# Patient Record
Sex: Female | Born: 1960 | Race: Asian | Hispanic: No | Marital: Married | Smoking: Never smoker
Health system: Southern US, Community
[De-identification: ages and names within clinical notes are randomized; demographics above are authoritative.]

## PROBLEM LIST (undated history)

## (undated) DIAGNOSIS — N159 Renal tubulo-interstitial disease, unspecified: Secondary | ICD-10-CM

## (undated) DIAGNOSIS — C541 Malignant neoplasm of endometrium: Secondary | ICD-10-CM

## (undated) DIAGNOSIS — N8502 Endometrial intraepithelial neoplasia [EIN]: Secondary | ICD-10-CM

## (undated) HISTORY — DX: Endometrial intraepithelial neoplasia (EIN): N85.02

## (undated) HISTORY — PX: COLONOSCOPY: SHX174

## (undated) HISTORY — PX: DILATATION & CURETTAGE/HYSTEROSCOPY WITH MYOSURE: SHX6511

---

## 2008-05-10 ENCOUNTER — Encounter: Admission: RE | Admit: 2008-05-10 | Discharge: 2008-05-10 | Payer: Self-pay | Admitting: Obstetrics and Gynecology

## 2008-05-16 ENCOUNTER — Other Ambulatory Visit: Admission: RE | Admit: 2008-05-16 | Discharge: 2008-05-16 | Payer: Self-pay | Admitting: Obstetrics and Gynecology

## 2009-07-23 ENCOUNTER — Encounter: Admission: RE | Admit: 2009-07-23 | Discharge: 2009-07-23 | Payer: Self-pay | Admitting: Obstetrics and Gynecology

## 2009-08-28 ENCOUNTER — Other Ambulatory Visit: Admission: RE | Admit: 2009-08-28 | Discharge: 2009-08-28 | Payer: Self-pay | Admitting: Obstetrics and Gynecology

## 2010-07-07 ENCOUNTER — Other Ambulatory Visit: Admission: RE | Admit: 2010-07-07 | Discharge: 2010-07-07 | Payer: Self-pay | Admitting: Internal Medicine

## 2010-08-11 ENCOUNTER — Encounter
Admission: RE | Admit: 2010-08-11 | Discharge: 2010-08-11 | Payer: Self-pay | Source: Home / Self Care | Attending: Obstetrics and Gynecology | Admitting: Obstetrics and Gynecology

## 2011-11-03 ENCOUNTER — Other Ambulatory Visit: Payer: Self-pay | Admitting: Internal Medicine

## 2011-11-03 DIAGNOSIS — Z1231 Encounter for screening mammogram for malignant neoplasm of breast: Secondary | ICD-10-CM

## 2011-11-04 ENCOUNTER — Ambulatory Visit
Admission: RE | Admit: 2011-11-04 | Discharge: 2011-11-04 | Disposition: A | Discharge status: Home / Self Care | Payer: PRIVATE HEALTH INSURANCE | Source: Ambulatory Visit | Attending: Internal Medicine | Admitting: Internal Medicine

## 2011-11-04 DIAGNOSIS — Z1231 Encounter for screening mammogram for malignant neoplasm of breast: Secondary | ICD-10-CM

## 2011-11-10 ENCOUNTER — Ambulatory Visit: Payer: Self-pay

## 2013-01-29 ENCOUNTER — Other Ambulatory Visit: Payer: Self-pay

## 2013-01-29 DIAGNOSIS — Z1231 Encounter for screening mammogram for malignant neoplasm of breast: Secondary | ICD-10-CM

## 2013-03-01 ENCOUNTER — Ambulatory Visit: Payer: PRIVATE HEALTH INSURANCE

## 2013-03-13 ENCOUNTER — Ambulatory Visit
Admission: RE | Admit: 2013-03-13 | Discharge: 2013-03-13 | Disposition: A | Discharge status: Home / Self Care | Payer: PRIVATE HEALTH INSURANCE | Source: Ambulatory Visit

## 2013-03-13 DIAGNOSIS — Z1231 Encounter for screening mammogram for malignant neoplasm of breast: Secondary | ICD-10-CM

## 2013-11-21 LAB — HM COLONOSCOPY

## 2014-07-02 ENCOUNTER — Other Ambulatory Visit: Payer: Self-pay | Admitting: Obstetrics and Gynecology

## 2014-07-02 DIAGNOSIS — N63 Unspecified lump in unspecified breast: Secondary | ICD-10-CM

## 2014-07-09 ENCOUNTER — Ambulatory Visit
Admission: RE | Admit: 2014-07-09 | Discharge: 2014-07-09 | Disposition: A | Discharge status: Home / Self Care | Payer: Commercial Managed Care - PPO | Source: Ambulatory Visit | Attending: Obstetrics and Gynecology | Admitting: Obstetrics and Gynecology

## 2014-07-09 DIAGNOSIS — N63 Unspecified lump in unspecified breast: Secondary | ICD-10-CM

## 2014-07-12 ENCOUNTER — Other Ambulatory Visit: Payer: PRIVATE HEALTH INSURANCE

## 2014-12-24 ENCOUNTER — Encounter: Payer: Self-pay | Admitting: Family Medicine

## 2015-04-30 ENCOUNTER — Other Ambulatory Visit: Payer: Self-pay

## 2015-04-30 ENCOUNTER — Ambulatory Visit
Admission: RE | Admit: 2015-04-30 | Discharge: 2015-04-30 | Disposition: A | Discharge status: Home / Self Care | Payer: Commercial Managed Care - PPO | Source: Ambulatory Visit

## 2015-04-30 DIAGNOSIS — Z1231 Encounter for screening mammogram for malignant neoplasm of breast: Secondary | ICD-10-CM

## 2015-11-26 HISTORY — PX: HYSTEROSCOPY: SHX211

## 2016-02-04 ENCOUNTER — Encounter (INDEPENDENT_AMBULATORY_CARE_PROVIDER_SITE_OTHER): Payer: Commercial Managed Care - PPO | Admitting: Ophthalmology

## 2016-02-04 DIAGNOSIS — H5213 Myopia, bilateral: Secondary | ICD-10-CM

## 2016-02-04 DIAGNOSIS — H33302 Unspecified retinal break, left eye: Secondary | ICD-10-CM | POA: Diagnosis not present

## 2016-02-04 DIAGNOSIS — H43813 Vitreous degeneration, bilateral: Secondary | ICD-10-CM | POA: Diagnosis not present

## 2016-02-18 ENCOUNTER — Ambulatory Visit (INDEPENDENT_AMBULATORY_CARE_PROVIDER_SITE_OTHER): Payer: Commercial Managed Care - PPO | Admitting: Ophthalmology

## 2016-02-18 DIAGNOSIS — H33302 Unspecified retinal break, left eye: Secondary | ICD-10-CM

## 2016-05-05 ENCOUNTER — Other Ambulatory Visit (HOSPITAL_COMMUNITY): Payer: Self-pay | Admitting: Internal Medicine

## 2016-05-05 ENCOUNTER — Ambulatory Visit (HOSPITAL_COMMUNITY)
Admission: RE | Admit: 2016-05-05 | Discharge: 2016-05-05 | Disposition: A | Discharge status: Home / Self Care | Payer: Commercial Managed Care - PPO | Source: Ambulatory Visit | Attending: Internal Medicine | Admitting: Internal Medicine

## 2016-05-05 DIAGNOSIS — L0211 Cutaneous abscess of neck: Secondary | ICD-10-CM

## 2016-05-05 DIAGNOSIS — K122 Cellulitis and abscess of mouth: Secondary | ICD-10-CM | POA: Diagnosis not present

## 2016-05-05 DIAGNOSIS — R591 Generalized enlarged lymph nodes: Secondary | ICD-10-CM | POA: Diagnosis not present

## 2016-05-05 MED ORDER — IOPAMIDOL (ISOVUE-300) INJECTION 61%
75.0000 mL | Freq: Once | INTRAVENOUS | Status: AC | PRN
  Administered 2016-05-05: 75 mL via INTRAVENOUS

## 2016-06-21 ENCOUNTER — Ambulatory Visit (INDEPENDENT_AMBULATORY_CARE_PROVIDER_SITE_OTHER): Payer: Commercial Managed Care - PPO | Admitting: Ophthalmology

## 2016-08-04 ENCOUNTER — Ambulatory Visit (INDEPENDENT_AMBULATORY_CARE_PROVIDER_SITE_OTHER): Payer: Commercial Managed Care - PPO | Admitting: Ophthalmology

## 2016-10-21 ENCOUNTER — Ambulatory Visit (INDEPENDENT_AMBULATORY_CARE_PROVIDER_SITE_OTHER): Payer: Commercial Managed Care - PPO | Admitting: Ophthalmology

## 2016-10-21 DIAGNOSIS — H2513 Age-related nuclear cataract, bilateral: Secondary | ICD-10-CM | POA: Diagnosis not present

## 2016-10-21 DIAGNOSIS — H43813 Vitreous degeneration, bilateral: Secondary | ICD-10-CM

## 2016-10-21 DIAGNOSIS — H33302 Unspecified retinal break, left eye: Secondary | ICD-10-CM | POA: Diagnosis not present

## 2016-11-25 DIAGNOSIS — N920 Excessive and frequent menstruation with regular cycle: Secondary | ICD-10-CM | POA: Diagnosis not present

## 2016-12-07 DIAGNOSIS — S63521A Sprain of radiocarpal joint of right wrist, initial encounter: Secondary | ICD-10-CM | POA: Diagnosis not present

## 2016-12-20 DIAGNOSIS — N959 Unspecified menopausal and perimenopausal disorder: Secondary | ICD-10-CM | POA: Diagnosis not present

## 2016-12-20 DIAGNOSIS — N840 Polyp of corpus uteri: Secondary | ICD-10-CM | POA: Diagnosis not present

## 2017-01-24 DIAGNOSIS — Z0000 Encounter for general adult medical examination without abnormal findings: Secondary | ICD-10-CM | POA: Diagnosis not present

## 2017-01-24 DIAGNOSIS — N390 Urinary tract infection, site not specified: Secondary | ICD-10-CM | POA: Diagnosis not present

## 2017-05-26 DIAGNOSIS — N8502 Endometrial intraepithelial neoplasia [EIN]: Secondary | ICD-10-CM | POA: Diagnosis not present

## 2017-05-26 DIAGNOSIS — Z130 Encounter for screening for diseases of the blood and blood-forming organs and certain disorders involving the immune mechanism: Secondary | ICD-10-CM | POA: Diagnosis not present

## 2017-05-26 DIAGNOSIS — Z01419 Encounter for gynecological examination (general) (routine) without abnormal findings: Secondary | ICD-10-CM | POA: Diagnosis not present

## 2017-05-26 DIAGNOSIS — Z124 Encounter for screening for malignant neoplasm of cervix: Secondary | ICD-10-CM | POA: Diagnosis not present

## 2017-05-26 DIAGNOSIS — N840 Polyp of corpus uteri: Secondary | ICD-10-CM | POA: Diagnosis not present

## 2017-05-26 DIAGNOSIS — Z1231 Encounter for screening mammogram for malignant neoplasm of breast: Secondary | ICD-10-CM | POA: Diagnosis not present

## 2017-05-27 DIAGNOSIS — Z124 Encounter for screening for malignant neoplasm of cervix: Secondary | ICD-10-CM | POA: Diagnosis not present

## 2017-06-01 LAB — HM MAMMOGRAPHY

## 2017-06-07 ENCOUNTER — Telehealth: Payer: Self-pay | Admitting: *Deleted

## 2017-06-07 NOTE — Telephone Encounter (Signed)
Spoke with Janett Billow at [facility] and gave the new patient appt for November 5th at 12pm. Janett Billow will call the patient.

## 2017-06-27 ENCOUNTER — Encounter: Payer: Self-pay | Admitting: Gynecologic Oncology

## 2017-06-27 ENCOUNTER — Ambulatory Visit: Payer: Commercial Managed Care - PPO | Attending: Gynecologic Oncology | Admitting: Gynecologic Oncology

## 2017-06-27 VITALS — BP 127/82 | HR 64 | Temp 98.6°F | Resp 18 | Ht 61.0 in | Wt 126.3 lb

## 2017-06-27 DIAGNOSIS — N8500 Endometrial hyperplasia, unspecified: Secondary | ICD-10-CM | POA: Insufficient documentation

## 2017-06-27 DIAGNOSIS — N8502 Endometrial intraepithelial neoplasia [EIN]: Secondary | ICD-10-CM

## 2017-06-27 DIAGNOSIS — Z9889 Other specified postprocedural states: Secondary | ICD-10-CM | POA: Insufficient documentation

## 2017-06-27 DIAGNOSIS — Z800 Family history of malignant neoplasm of digestive organs: Secondary | ICD-10-CM | POA: Diagnosis not present

## 2017-06-27 DIAGNOSIS — Z8249 Family history of ischemic heart disease and other diseases of the circulatory system: Secondary | ICD-10-CM | POA: Diagnosis not present

## 2017-06-27 DIAGNOSIS — Z818 Family history of other mental and behavioral disorders: Secondary | ICD-10-CM | POA: Diagnosis not present

## 2017-06-27 DIAGNOSIS — N8501 Benign endometrial hyperplasia: Secondary | ICD-10-CM

## 2017-06-27 DIAGNOSIS — Z833 Family history of diabetes mellitus: Secondary | ICD-10-CM | POA: Diagnosis not present

## 2017-06-27 DIAGNOSIS — Z8742 Personal history of other diseases of the female genital tract: Secondary | ICD-10-CM | POA: Insufficient documentation

## 2017-06-27 NOTE — Patient Instructions (Addendum)
Dr Denman George has scheduled you for a complete hysterectomy, with removal of tubes and ovaries.   If you decide that you would prefer for treatment without hysterectomy, let Dr Serita Grit office know at [phone] and she will schedule you for a D&C procedure with placement of a progestin releasing IUD.                Preparing for your Surgery  Plan for surgery on November 27 with Dr. Everitt Amber at [facility] will be scheduled for a robotic assisted total hysterectomy, bilateral salpingo-oophorectomy, sentinel lymph node biopsy.  If you decide to change your procedure to a dilation and curettage of the uterus with IUD placement please call our office at [phone]  Pre-operative Testing -You will receive a phone call from presurgical testing at [facility] to arrange for a pre-operative testing appointment before your surgery.  This appointment normally occurs one to two weeks before your scheduled surgery.   -Bring your insurance card, copy of an advanced directive if applicable, medication list  -At that visit, you will be asked to sign a consent for a possible blood transfusion in case a transfusion becomes necessary during surgery.  The need for a blood transfusion is rare but having consent is a necessary part of your care.     -You should not be taking blood thinners or aspirin at least ten days prior to surgery unless instructed by your surgeon.  Day Before Surgery at [facility] will be asked to take in a light diet the day before surgery.  Avoid carbonated beverages.  You will be advised to have nothing to eat or drink after midnight the evening before.    Eat a light diet the day before surgery.  Examples including soups, broths, toast, yogurt, mashed potatoes.  Things to avoid include carbonated beverages  (fizzy beverages), raw fruits and raw vegetables, or beans.   If your bowels are filled with gas, your surgeon will have difficulty visualizing your pelvic  organs which increases your surgical risks.  Your role in recovery Your role is to become active as soon as directed by your doctor, while still giving yourself time to heal.  Rest when you feel tired. You will be asked to do the following in order to speed your recovery:  - Cough and breathe deeply. This helps toclear and expand your lungs and can prevent pneumonia. You may be given a spirometer to practice deep breathing. A staff member will show you how to use the spirometer. - Do mild physical activity. Walking or moving your legs help your circulation and body functions return to normal. A staff member will help you when you try to walk and will provide you with simple exercises. Do not try to get up or walk alone the first time. - Actively manage your pain. Managing your pain lets you move in comfort. We will ask you to rate your pain on a scale of zero to 10. It is your responsibility to tell your doctor or nurse where and how much you hurt so your pain can be treated.  Special Considerations -If you are diabetic, you may be placed on insulin after surgery to have closer control over your blood sugars to promote healing and recovery.  This does not mean that you will be discharged on insulin.  If applicable, your oral antidiabetics will be resumed when you are tolerating a solid diet.  -Your final pathology results from surgery should  be available by the Friday after surgery and the results will be relayed to you when available.  -Dr. Lahoma Crocker is the Surgeon that assists your GYN Oncologist with surgery.  The next day after your surgery you will either see your GYN Oncologist or Dr. Lahoma Crocker.   Blood Transfusion Information WHAT IS A BLOOD TRANSFUSION? A transfusion is the replacement of blood or some of its parts. Blood is made up of multiple cells which provide different functions.  Red blood cells carry oxygen and are used for blood loss replacement.  White blood  cells fight against infection.  Platelets control bleeding.  Plasma helps clot blood.  Other blood products are available for specialized needs, such as hemophilia or other clotting disorders. BEFORE THE TRANSFUSION  Who gives blood for transfusions?   You may be able to donate blood to be used at a later date on yourself (autologous donation).  Relatives can be asked to donate blood. This is generally not any safer than if you have received blood from a stranger. The same precautions are taken to ensure safety when a relative's blood is donated.  Healthy volunteers who are fully evaluated to make sure their blood is safe. This is blood bank blood. Transfusion therapy is the safest it has ever been in the practice of medicine. Before blood is taken from a donor, a complete history is taken to make sure that person has no history of diseases nor engages in risky social behavior (examples are intravenous drug use or sexual activity with multiple partners). The donor's travel history is screened to minimize risk of transmitting infections, such as malaria. The donated blood is tested for signs of infectious diseases, such as HIV and hepatitis. The blood is then tested to be sure it is compatible with you in order to minimize the chance of a transfusion reaction. If you or a relative donates blood, this is often done in anticipation of surgery and is not appropriate for emergency situations. It takes many days to process the donated blood. RISKS AND COMPLICATIONS Although transfusion therapy is very safe and saves many lives, the main dangers of transfusion include:   Getting an infectious disease.  Developing a transfusion reaction. This is an allergic reaction to something in the blood you were given. Every precaution is taken to prevent this. The decision to have a blood transfusion has been considered carefully by your caregiver before blood is given. Blood is not given unless the benefits  outweigh the risks.

## 2017-06-27 NOTE — H&P (View-Only) (Signed)
Consult Note: Gyn-Onc  Consult was requested by Dr. Paula Compton for the evaluation of Leslie Deleon 56 y.o. female  CC:  Chief Complaint  Patient presents with  . Endometrial hyperplasia, complex    Assessment/Plan:  [name]  is a 56 y.o.  year old thin Asian woman with complex atypical hyperplasia with atypia.  I discussed the premalignant/noninvasive nature of CAH, but the fact that in 40% of cases it is associated with invasive endometrial cancer found on definitive surgery.  Therefore I am recommending robotic assisted total hysterectomy, BSO and SLN biopsy. The patient was counseled regarding non-operative treatments including progestin releasing IUD. She is interested in this but is undecided.  We will schedule her for robotic hysterectomy, BSO, SLN bx. She will notify us if she changes her mind. I offered ovarian preservation, with the caveat being that if we don't remove the ovaries and there is invasive cancer we will not have staging information on the ovaries.  HPI: Leslie Deleon is a 56 year old P1 who is seen in consultation at the request of Dr Marvel Plan for [facility].  The patient reports irregular bleeding for several years. Polypectomy in 2017 was benign. Persistent bleeding occurred and a sonohysterogram revealed an 8cm uterus with endometrial thickening. Pipelle on10/4/18 showed CAH.    Current Meds:  No outpatient encounter medications on file as of 06/27/2017.   No facility-administered encounter medications on file as of 06/27/2017.     Allergy: No Known Allergies  Social Hx:   Social History   Socioeconomic History  . Marital status: Married    Spouse name: Not on file  . Number of children: Not on file  . Years of education: Not on file  . Highest education level: Not on file  Social Needs  . Financial resource strain: Not on file  . Food insecurity - worry: Not on file  . Food insecurity - inability: Not on file  . Transportation needs -  medical: Not on file  . Transportation needs - non-medical: Not on file  Occupational History  . Not on file  Tobacco Use  . Smoking status: Never Smoker  . Smokeless tobacco: Never Used  Substance and Sexual Activity  . Alcohol use: No    Frequency: Never  . Drug use: No  . Sexual activity: Yes    Birth control/protection: Condom  Other Topics Concern  . Not on file  Social History Narrative  . Not on file    Past Surgical Hx:  Past Surgical History:  Procedure Laterality Date  . CESAREAN SECTION    . DILATATION & CURETTAGE/HYSTEROSCOPY WITH MYOSURE     polypectomy  . HYSTEROSCOPY  11/26/2015    Past Medical Hx: History reviewed. No pertinent past medical history.  Past Gynecological History:  C/s x 1 No LMP recorded. Patient is not currently having periods (Reason: Perimenopausal).  Family Hx:  Family History  Problem Relation Age of Onset  . Diabetes Mother   . Hypertension Mother   . Hypertension Father   . Liver cancer Father   . Depression Brother   . Diabetes Maternal Grandmother     Review of Systems:  Constitutional  Feels well,    ENT Normal appearing ears and nares bilaterally Skin/Breast  No rash, sores, jaundice, itching, dryness Cardiovascular  No chest pain, shortness of breath, or edema  Pulmonary  No cough or wheeze.  Gastro Intestinal  No nausea, vomitting, or diarrhoea. No bright red blood per rectum, no abdominal  pain, change in bowel movement, or constipation.  Genito Urinary  No frequency, urgency, dysuria, irregular bleeding Musculo Skeletal  No myalgia, arthralgia, joint swelling or pain  Neurologic  No weakness, numbness, change in gait,  Psychology  No depression, anxiety, insomnia.   Vitals:  Blood pressure 127/82, pulse 64, temperature 98.6 F (37 C), temperature source Oral, resp. rate 18, height 5\' 1"  (1.549 m), weight 126 lb 4.8 oz (57.3 kg), SpO2 100 %.  Physical Exam: WD in NAD Neck  Supple NROM, without any  enlargements.  Lymph Node Survey No cervical supraclavicular or inguinal adenopathy Cardiovascular  Pulse normal rate, regularity and rhythm. S1 and S2 normal.  Lungs  Clear to auscultation bilateraly, without wheezes/crackles/rhonchi. Good air movement.  Skin  No rash/lesions/breakdown  Psychiatry  Alert and oriented to person, place, and time  Abdomen  Normoactive bowel sounds, abdomen soft, non-tender and thin without evidence of hernia.  Back No CVA tenderness Genito Urinary  Vulva/vagina: Normal external female genitalia.   No lesions. No discharge or bleeding.  Bladder/urethra:  No lesions or masses, well supported bladder  Vagina: normal  Cervix: Normal appearing, no lesions.  Uterus:  Small, mobile, no parametrial involvement or nodularity.  Adnexa: no palpable masses. Rectal  deferred Extremities  No bilateral cyanosis, clubbing or edema.   Donaciano Eva, MD  06/27/2017, 1:18 PM

## 2017-06-27 NOTE — Progress Notes (Signed)
Consult Note: Gyn-Onc  Consult was requested by Dr. Paula Compton for the evaluation of Leslie Deleon 56 y.o. female  CC:  Chief Complaint  Patient presents with  . Endometrial hyperplasia, complex    Assessment/Plan:  [name]  is a 57 y.o.  year old thin Asian woman with complex atypical hyperplasia with atypia.  I discussed the premalignant/noninvasive nature of CAH, but the fact that in 40% of cases it is associated with invasive endometrial cancer found on definitive surgery.  Therefore I am recommending robotic assisted total hysterectomy, BSO and SLN biopsy. The patient was counseled regarding non-operative treatments including progestin releasing IUD. She is interested in this but is undecided.  We will schedule her for robotic hysterectomy, BSO, SLN bx. She will notify us if she changes her mind. I offered ovarian preservation, with the caveat being that if we don't remove the ovaries and there is invasive cancer we will not have staging information on the ovaries.  HPI: Leslie Deleon is a 56 year old P1 who is seen in consultation at the request of Dr Marvel Plan for [facility].  The patient reports irregular bleeding for several years. Polypectomy in 2017 was benign. Persistent bleeding occurred and a sonohysterogram revealed an 8cm uterus with endometrial thickening. Pipelle on10/4/18 showed CAH.    Current Meds:  No outpatient encounter medications on file as of 06/27/2017.   No facility-administered encounter medications on file as of 06/27/2017.     Allergy: No Known Allergies  Social Hx:   Social History   Socioeconomic History  . Marital status: Married    Spouse name: Not on file  . Number of children: Not on file  . Years of education: Not on file  . Highest education level: Not on file  Social Needs  . Financial resource strain: Not on file  . Food insecurity - worry: Not on file  . Food insecurity - inability: Not on file  . Transportation needs -  medical: Not on file  . Transportation needs - non-medical: Not on file  Occupational History  . Not on file  Tobacco Use  . Smoking status: Never Smoker  . Smokeless tobacco: Never Used  Substance and Sexual Activity  . Alcohol use: No    Frequency: Never  . Drug use: No  . Sexual activity: Yes    Birth control/protection: Condom  Other Topics Concern  . Not on file  Social History Narrative  . Not on file    Past Surgical Hx:  Past Surgical History:  Procedure Laterality Date  . CESAREAN SECTION    . DILATATION & CURETTAGE/HYSTEROSCOPY WITH MYOSURE     polypectomy  . HYSTEROSCOPY  11/26/2015    Past Medical Hx: History reviewed. No pertinent past medical history.  Past Gynecological History:  C/s x 1 No LMP recorded. Patient is not currently having periods (Reason: Perimenopausal).  Family Hx:  Family History  Problem Relation Age of Onset  . Diabetes Mother   . Hypertension Mother   . Hypertension Father   . Liver cancer Father   . Depression Brother   . Diabetes Maternal Grandmother     Review of Systems:  Constitutional  Feels well,    ENT Normal appearing ears and nares bilaterally Skin/Breast  No rash, sores, jaundice, itching, dryness Cardiovascular  No chest pain, shortness of breath, or edema  Pulmonary  No cough or wheeze.  Gastro Intestinal  No nausea, vomitting, or diarrhoea. No bright red blood per rectum, no abdominal  pain, change in bowel movement, or constipation.  Genito Urinary  No frequency, urgency, dysuria, irregular bleeding Musculo Skeletal  No myalgia, arthralgia, joint swelling or pain  Neurologic  No weakness, numbness, change in gait,  Psychology  No depression, anxiety, insomnia.   Vitals:  Blood pressure 127/82, pulse 64, temperature 98.6 F (37 C), temperature source Oral, resp. rate 18, height 5\' 1"  (1.549 m), weight 126 lb 4.8 oz (57.3 kg), SpO2 100 %.  Physical Exam: WD in NAD Neck  Supple NROM, without any  enlargements.  Lymph Node Survey No cervical supraclavicular or inguinal adenopathy Cardiovascular  Pulse normal rate, regularity and rhythm. S1 and S2 normal.  Lungs  Clear to auscultation bilateraly, without wheezes/crackles/rhonchi. Good air movement.  Skin  No rash/lesions/breakdown  Psychiatry  Alert and oriented to person, place, and time  Abdomen  Normoactive bowel sounds, abdomen soft, non-tender and thin without evidence of hernia.  Back No CVA tenderness Genito Urinary  Vulva/vagina: Normal external female genitalia.   No lesions. No discharge or bleeding.  Bladder/urethra:  No lesions or masses, well supported bladder  Vagina: normal  Cervix: Normal appearing, no lesions.  Uterus:  Small, mobile, no parametrial involvement or nodularity.  Adnexa: no palpable masses. Rectal  deferred Extremities  No bilateral cyanosis, clubbing or edema.   Donaciano Eva, MD  06/27/2017, 1:18 PM

## 2017-07-01 ENCOUNTER — Telehealth: Payer: Self-pay | Admitting: Gynecologic Oncology

## 2017-07-01 DIAGNOSIS — Z6823 Body mass index (BMI) 23.0-23.9, adult: Secondary | ICD-10-CM | POA: Diagnosis not present

## 2017-07-01 DIAGNOSIS — N8501 Benign endometrial hyperplasia: Secondary | ICD-10-CM | POA: Diagnosis not present

## 2017-07-01 NOTE — Telephone Encounter (Signed)
Returned call to patient.  All questions answered.  Patient asking about surgery.

## 2017-07-02 DIAGNOSIS — H00035 Abscess of left lower eyelid: Secondary | ICD-10-CM | POA: Diagnosis not present

## 2017-07-08 ENCOUNTER — Telehealth: Payer: Self-pay | Admitting: Gynecologic Oncology

## 2017-07-08 NOTE — Patient Instructions (Signed)
Leslie Deleon  07/08/2017   Your procedure is scheduled on: 07-19-17  Report to [facility] Main  Entrance Take Susan Moore  elevators to 3rd floor to  [place] at [phone].    Call this number if you have problems the morning of surgery [phone]    Remember: ONLY 1 PERSON MAY GO WITH YOU TO SHORT STAY TO GET  READY MORNING OF [place].    Eat a light diet the day before surgery.  Examples including soups, broths, toast, yogurt, mashed potatoes.  Things to avoid include carbonated beverages (fizzy beverages), raw fruits and raw vegetables, or beans. If your bowels are filled with gas, your surgeon will have difficulty visualizing your pelvic organs which increases your surgical risks.  Do not eat food or drink liquids :After Midnight.     Take these medicines the morning of surgery with A SIP OF WATER: eye drops                                 You may not have any metal on your body including hair pins and              piercings  Do not wear jewelry, make-up, lotions, powders or perfumes, deodorant             Do not wear nail polish.  Do not shave  48 hours prior to surgery.             Do not bring valuables to the hospital. [place].  Contacts, dentures or bridgework may not be worn into surgery.  Leave suitcase in the car. After surgery it may be brought to your room.                 Please read over the following fact sheets you were given: _____________________________________________________________________   [facility] - Preparing for Surgery Before surgery, you can play an important role.  Because skin is not sterile, your skin needs to be as free of germs as possible.  You can reduce the number of germs on your skin by washing with CHG (chlorahexidine gluconate) soap before surgery.  CHG is an antiseptic cleaner which kills germs and bonds with the skin to continue killing germs even after  washing. Please DO NOT use if you have an allergy to CHG or antibacterial soaps.  If your skin becomes reddened/irritated stop using the CHG and inform your nurse when you arrive at Short Stay. Do not shave (including legs and underarms) for at least 48 hours prior to the first CHG shower.  You may shave your face/neck. Please follow these instructions carefully:  1.  Shower with CHG Soap the night before surgery and the  morning of Surgery.  2.  If you choose to wash your hair, wash your hair first as usual with your  normal  shampoo.  3.  After you shampoo, rinse your hair and body thoroughly to remove the  shampoo.                           4.  Use CHG as you would any other liquid soap.  You can apply chg directly  to  the skin and wash                       Gently with a scrungie or clean washcloth.  5.  Apply the CHG Soap to your body ONLY FROM THE NECK DOWN.   Do not use on face/ open                           Wound or open sores. Avoid contact with eyes, ears mouth and genitals (private parts).                       Wash face,  Genitals (private parts) with your normal soap.             6.  Wash thoroughly, paying special attention to the area where your surgery  will be performed.  7.  Thoroughly rinse your body with warm water from the neck down.  8.  DO NOT shower/wash with your normal soap after using and rinsing off  the CHG Soap.                9.  Pat yourself dry with a clean towel.            10.  Wear clean pajamas.            11.  Place clean sheets on your bed the night of your first shower and do not  sleep with pets. Day of Surgery : Do not apply any lotions/deodorants the morning of surgery.  Please wear clean clothes to the hospital/surgery center.  FAILURE TO FOLLOW THESE INSTRUCTIONS MAY RESULT IN THE CANCELLATION OF YOUR SURGERY PATIENT SIGNATURE_________________________________  NURSE  SIGNATURE__________________________________  ________________________________________________________________________   Leslie Deleon  An incentive spirometer is a tool that can help keep your lungs clear and active. This tool measures how well you are filling your lungs with each breath. Taking long deep breaths may help reverse or decrease the chance of developing breathing (pulmonary) problems (especially infection) following:  A long period of time when you are unable to move or be active. BEFORE THE PROCEDURE   If the spirometer includes an indicator to show your best effort, your nurse or respiratory therapist will set it to a desired goal.  If possible, sit up straight or lean slightly forward. Try not to slouch.  Hold the incentive spirometer in an upright position. INSTRUCTIONS FOR USE  1. Sit on the edge of your bed if possible, or sit up as far as you can in bed or on a chair. 2. Hold the incentive spirometer in an upright position. 3. Breathe out normally. 4. Place the mouthpiece in your mouth and seal your lips tightly around it. 5. Breathe in slowly and as deeply as possible, raising the piston or the ball toward the top of the column. 6. Hold your breath for 3-5 seconds or for as long as possible. Allow the piston or ball to fall to the bottom of the column. 7. Remove the mouthpiece from your mouth and breathe out normally. 8. Rest for a few seconds and repeat Steps 1 through 7 at least 10 times every 1-2 hours when you are awake. Take your time and take a few normal breaths between deep breaths. 9. The spirometer may include an indicator to show your best effort. Use the indicator as a goal to work toward during each repetition. 10. After each set  of 10 deep breaths, practice coughing to be sure your lungs are clear. If you have an incision (the cut made at the time of surgery), support your incision when coughing by placing a pillow or rolled up towels firmly  against it. Once you are able to get out of bed, walk around indoors and cough well. You may stop using the incentive spirometer when instructed by your caregiver.  RISKS AND COMPLICATIONS  Take your time so you do not get dizzy or light-headed.  If you are in pain, you may need to take or ask for pain medication before doing incentive spirometry. It is harder to take a deep breath if you are having pain. AFTER USE  Rest and breathe slowly and easily.  It can be helpful to keep track of a log of your progress. Your caregiver can provide you with a simple table to help with this. If you are using the spirometer at home, follow these instructions: [place] IF:   You are having difficultly using the spirometer.  You have trouble using the spirometer as often as instructed.  Your pain medication is not giving enough relief while using the spirometer.  You develop fever of 100.5 F (38.1 C) or higher. SEEK IMMEDIATE MEDICAL CARE IF:   You cough up bloody sputum that had not been present before.  You develop fever of 102 F (38.9 C) or greater.  You develop worsening pain at or near the incision site. MAKE SURE YOU:   Understand these instructions.  Will watch your condition.  Will get help right away if you are not doing well or get worse. Document Released: 12/20/2006 Document Revised: 11/01/2011 Document Reviewed: 02/20/2007 ExitCare Patient Information 2014 Exit[employer], [place].   ________________________________________________________________________  WHAT IS A BLOOD TRANSFUSION? Blood Transfusion Information  A transfusion is the replacement of blood or some of its parts. Blood is made up of multiple cells which provide different functions.  Red blood cells carry oxygen and are used for blood loss replacement.  White blood cells fight against infection.  Platelets control bleeding.  Plasma helps clot blood.  Other blood products are available for  specialized needs, such as hemophilia or other clotting disorders. BEFORE THE TRANSFUSION  Who gives blood for transfusions?   Healthy volunteers who are fully evaluated to make sure their blood is safe. This is blood bank blood. Transfusion therapy is the safest it has ever been in the practice of medicine. Before blood is taken from a donor, a complete history is taken to make sure that person has no history of diseases nor engages in risky social behavior (examples are intravenous drug use or sexual activity with multiple partners). The donor's travel history is screened to minimize risk of transmitting infections, such as malaria. The donated blood is tested for signs of infectious diseases, such as HIV and hepatitis. The blood is then tested to be sure it is compatible with you in order to minimize the chance of a transfusion reaction. If you or a relative donates blood, this is often done in anticipation of surgery and is not appropriate for emergency situations. It takes many days to process the donated blood. RISKS AND COMPLICATIONS Although transfusion therapy is very safe and saves many lives, the main dangers of transfusion include:   Getting an infectious disease.  Developing a transfusion reaction. This is an allergic reaction to something in the blood you were given. Every precaution is taken to prevent this. The decision to have a  blood transfusion has been considered carefully by your caregiver before blood is given. Blood is not given unless the benefits outweigh the risks. AFTER THE TRANSFUSION  Right after receiving a blood transfusion, you will usually feel much better and more energetic. This is especially true if your red blood cells have gotten low (anemic). The transfusion raises the level of the red blood cells which carry oxygen, and this usually causes an energy increase.  The nurse administering the transfusion will monitor you carefully for complications. HOME CARE  INSTRUCTIONS  No special instructions are needed after a transfusion. You may find your energy is better. Speak with your caregiver about any limitations on activity for underlying diseases you may have. SEEK MEDICAL CARE IF:   Your condition is not improving after your transfusion.  You develop redness or irritation at the intravenous (IV) site. SEEK IMMEDIATE MEDICAL CARE IF:  Any of the following symptoms occur over the next 12 hours:  Shaking chills.  You have a temperature by mouth above 102 F (38.9 C), not controlled by medicine.  Chest, back, or muscle pain.  People around you feel you are not acting correctly or are confused.  Shortness of breath or difficulty breathing.  Dizziness and fainting.  You get a rash or develop hives.  You have a decrease in urine output.  Your urine turns a dark color or changes to pink, red, or brown. Any of the following symptoms occur over the next 10 days:  You have a temperature by mouth above 102 F (38.9 C), not controlled by medicine.  Shortness of breath.  Weakness after normal activity.  The white part of the eye turns yellow (jaundice).  You have a decrease in the amount of urine or are urinating less often.  Your urine turns a dark color or changes to pink, red, or brown. Document Released: 08/06/2000 Document Revised: 11/01/2011 Document Reviewed: 03/25/2008 [facility] Patient Information 2014 [place], [place].  _______________________________________________________________________

## 2017-07-08 NOTE — Telephone Encounter (Signed)
Called to touch base with patient about upcoming surgery.  She had stated she would touch base with our office at the beginning of the week about whether she would want to proceed with a hysterectomy, or with a D&C/IUD placement.  The patient states she would like to proceed with a hysterectomy but she would like to keep her ovaries.  Advised patient that I would let Dr. Denman George know.  All questions answered.  Advised to call for any questions or concerns.

## 2017-07-08 NOTE — Progress Notes (Signed)
EKG 01-24-17 on chart [facility].

## 2017-07-11 ENCOUNTER — Other Ambulatory Visit: Payer: Self-pay

## 2017-07-11 ENCOUNTER — Encounter (HOSPITAL_COMMUNITY): Payer: Self-pay

## 2017-07-11 ENCOUNTER — Encounter (HOSPITAL_COMMUNITY)
Admission: RE | Admit: 2017-07-11 | Discharge: 2017-07-11 | Disposition: A | Discharge status: Home / Self Care | Payer: Commercial Managed Care - PPO | Source: Ambulatory Visit | Attending: Gynecologic Oncology | Admitting: Gynecologic Oncology

## 2017-07-11 DIAGNOSIS — C541 Malignant neoplasm of endometrium: Secondary | ICD-10-CM | POA: Diagnosis not present

## 2017-07-11 DIAGNOSIS — Z01812 Encounter for preprocedural laboratory examination: Secondary | ICD-10-CM | POA: Diagnosis not present

## 2017-07-11 HISTORY — DX: Renal tubulo-interstitial disease, unspecified: N15.9

## 2017-07-11 HISTORY — DX: Malignant neoplasm of endometrium: C54.1

## 2017-07-11 LAB — COMPREHENSIVE METABOLIC PANEL
ALBUMIN: 4.1 g/dL (ref 3.5–5.0)
ALK PHOS: 54 U/L (ref 38–126)
ALT: 27 U/L (ref 14–54)
ANION GAP: 7 (ref 5–15)
AST: 30 U/L (ref 15–41)
BILIRUBIN TOTAL: 0.7 mg/dL (ref 0.3–1.2)
BUN: 20 mg/dL (ref 6–20)
CALCIUM: 9.4 mg/dL (ref 8.9–10.3)
CO2: 28 mmol/L (ref 22–32)
Chloride: 107 mmol/L (ref 101–111)
Creatinine, Ser: 0.65 mg/dL (ref 0.44–1.00)
GFR calc Af Amer: >60 mL/min (ref >60)
GFR calc non Af Amer: >60 mL/min (ref >60)
Glucose, Bld: 106 mg/dL — ABNORMAL HIGH (ref 65–99)
POTASSIUM: 4.1 mmol/L (ref 3.5–5.1)
SODIUM: 142 mmol/L (ref 135–145)
TOTAL PROTEIN: 7.6 g/dL (ref 6.5–8.1)

## 2017-07-11 LAB — CBC
HEMATOCRIT: 39.1 % (ref 36.0–46.0)
HEMOGLOBIN: 12.7 g/dL (ref 12.0–15.0)
MCH: 30 pg (ref 26.0–34.0)
MCHC: 32.5 g/dL (ref 30.0–36.0)
MCV: 92.2 fL (ref 78.0–100.0)
Platelets: 208 10*3/uL (ref 150–400)
RBC: 4.24 MIL/uL (ref 3.87–5.11)
RDW: 13.4 % (ref 11.5–15.5)
WBC: 4.1 10*3/uL (ref 4.0–10.5)

## 2017-07-11 LAB — URINALYSIS, ROUTINE W REFLEX MICROSCOPIC
BILIRUBIN URINE: NEGATIVE
GLUCOSE, UA: NEGATIVE mg/dL
HGB URINE DIPSTICK: NEGATIVE
KETONES UR: NEGATIVE mg/dL
Leukocytes, UA: NEGATIVE
Nitrite: NEGATIVE
PH: 6 (ref 5.0–8.0)
Protein, ur: NEGATIVE mg/dL
Specific Gravity, Urine: 1.01 (ref 1.005–1.030)

## 2017-07-11 LAB — ABO/RH: ABO/RH(D): A POS

## 2017-07-11 LAB — PREGNANCY, URINE: Preg Test, Ur: NEGATIVE

## 2017-07-13 ENCOUNTER — Telehealth: Payer: Self-pay | Admitting: Gynecologic Oncology

## 2017-07-13 NOTE — Telephone Encounter (Signed)
Error

## 2017-07-19 ENCOUNTER — Ambulatory Visit (HOSPITAL_COMMUNITY): Payer: Commercial Managed Care - PPO | Admitting: Certified Registered Nurse Anesthetist

## 2017-07-19 ENCOUNTER — Encounter (HOSPITAL_COMMUNITY): Payer: Self-pay | Admitting: *Deleted

## 2017-07-19 ENCOUNTER — Other Ambulatory Visit: Payer: Self-pay

## 2017-07-19 ENCOUNTER — Encounter (HOSPITAL_COMMUNITY)
Admission: RE | Disposition: A | Discharge status: Home / Self Care | Payer: Self-pay | Source: Ambulatory Visit | Attending: Gynecologic Oncology

## 2017-07-19 ENCOUNTER — Ambulatory Visit (HOSPITAL_COMMUNITY)
Admission: RE | Admit: 2017-07-19 | Discharge: 2017-07-19 | Disposition: A | Discharge status: Home / Self Care | Payer: Commercial Managed Care - PPO | Source: Ambulatory Visit | Attending: Gynecologic Oncology | Admitting: Gynecologic Oncology

## 2017-07-19 DIAGNOSIS — N838 Other noninflammatory disorders of ovary, fallopian tube and broad ligament: Secondary | ICD-10-CM | POA: Diagnosis not present

## 2017-07-19 DIAGNOSIS — N888 Other specified noninflammatory disorders of cervix uteri: Secondary | ICD-10-CM | POA: Diagnosis not present

## 2017-07-19 DIAGNOSIS — D259 Leiomyoma of uterus, unspecified: Secondary | ICD-10-CM | POA: Diagnosis not present

## 2017-07-19 DIAGNOSIS — N8501 Benign endometrial hyperplasia: Secondary | ICD-10-CM

## 2017-07-19 DIAGNOSIS — N8502 Endometrial intraepithelial neoplasia [EIN]: Secondary | ICD-10-CM | POA: Diagnosis not present

## 2017-07-19 DIAGNOSIS — Z79899 Other long term (current) drug therapy: Secondary | ICD-10-CM | POA: Diagnosis not present

## 2017-07-19 DIAGNOSIS — Z8742 Personal history of other diseases of the female genital tract: Secondary | ICD-10-CM | POA: Diagnosis present

## 2017-07-19 HISTORY — PX: SENTINEL NODE BIOPSY: SHX6608

## 2017-07-19 HISTORY — PX: ROBOTIC ASSISTED TOTAL HYSTERECTOMY WITH BILATERAL SALPINGO OOPHERECTOMY: SHX6086

## 2017-07-19 LAB — TYPE AND SCREEN
ABO/RH(D): A POS
ANTIBODY SCREEN: NEGATIVE

## 2017-07-19 SURGERY — HYSTERECTOMY, TOTAL, ROBOT-ASSISTED, LAPAROSCOPIC, WITH BILATERAL SALPINGO-OOPHORECTOMY
Anesthesia: General

## 2017-07-19 MED ORDER — KETOROLAC TROMETHAMINE 30 MG/ML IJ SOLN
INTRAMUSCULAR | Status: DC | PRN
  Administered 2017-07-19: 30 mg via INTRAVENOUS

## 2017-07-19 MED ORDER — PROMETHAZINE HCL 25 MG/ML IJ SOLN: 6.2500 mg | INTRAMUSCULAR | Status: DC | PRN

## 2017-07-19 MED ORDER — METOCLOPRAMIDE HCL 5 MG/ML IJ SOLN
INTRAMUSCULAR | Status: AC
  Filled 2017-07-19: qty 2

## 2017-07-19 MED ORDER — LIDOCAINE 2% (20 MG/ML) 5 ML SYRINGE
INTRAMUSCULAR | Status: DC | PRN
  Administered 2017-07-19: 1.5 mg/kg/h via INTRAVENOUS

## 2017-07-19 MED ORDER — BUPIVACAINE HCL (PF) 0.5 % IJ SOLN
INTRAMUSCULAR | Status: AC
  Filled 2017-07-19: qty 30

## 2017-07-19 MED ORDER — LACTATED RINGERS IV SOLN
INTRAVENOUS | Status: DC | PRN
  Administered 2017-07-19 (×2): via INTRAVENOUS

## 2017-07-19 MED ORDER — ROCURONIUM BROMIDE 50 MG/5ML IV SOSY
PREFILLED_SYRINGE | INTRAVENOUS | Status: AC
  Filled 2017-07-19: qty 5

## 2017-07-19 MED ORDER — OXYCODONE-ACETAMINOPHEN 5-325 MG PO TABS: 1.0000 | ORAL_TABLET | ORAL | 0 refills | Status: DC | PRN

## 2017-07-19 MED ORDER — STERILE WATER FOR INJECTION IJ SOLN
INTRAMUSCULAR | Status: AC
  Filled 2017-07-19: qty 10

## 2017-07-19 MED ORDER — DIPHENHYDRAMINE HCL 50 MG/ML IJ SOLN
INTRAMUSCULAR | Status: DC | PRN
  Administered 2017-07-19: 12.5 mg via INTRAVENOUS

## 2017-07-19 MED ORDER — LIDOCAINE 2% (20 MG/ML) 5 ML SYRINGE
INTRAMUSCULAR | Status: AC
  Filled 2017-07-19: qty 5

## 2017-07-19 MED ORDER — LACTATED RINGERS IR SOLN
Status: DC | PRN
  Administered 2017-07-19: 1000 mL

## 2017-07-19 MED ORDER — SCOPOLAMINE 1 MG/3DAYS TD PT72
MEDICATED_PATCH | TRANSDERMAL | Status: DC | PRN
  Administered 2017-07-19: 1 via TRANSDERMAL

## 2017-07-19 MED ORDER — ONDANSETRON HCL 4 MG/2ML IJ SOLN
INTRAMUSCULAR | Status: AC
  Filled 2017-07-19: qty 2

## 2017-07-19 MED ORDER — ONDANSETRON HCL 4 MG/2ML IJ SOLN
INTRAMUSCULAR | Status: DC | PRN
  Administered 2017-07-19: 4 mg via INTRAVENOUS

## 2017-07-19 MED ORDER — FENTANYL CITRATE (PF) 250 MCG/5ML IJ SOLN
INTRAMUSCULAR | Status: AC
  Filled 2017-07-19: qty 5

## 2017-07-19 MED ORDER — KETOROLAC TROMETHAMINE 30 MG/ML IJ SOLN
INTRAMUSCULAR | Status: AC
  Filled 2017-07-19: qty 1

## 2017-07-19 MED ORDER — DEXAMETHASONE SODIUM PHOSPHATE 10 MG/ML IJ SOLN
INTRAMUSCULAR | Status: AC
  Filled 2017-07-19: qty 1

## 2017-07-19 MED ORDER — SCOPOLAMINE 1 MG/3DAYS TD PT72
MEDICATED_PATCH | TRANSDERMAL | Status: AC
  Filled 2017-07-19: qty 1

## 2017-07-19 MED ORDER — HYDROMORPHONE HCL 1 MG/ML IJ SOLN
0.2500 mg | INTRAMUSCULAR | Status: DC | PRN
Start: 2017-07-19 — End: 2017-07-19

## 2017-07-19 MED ORDER — CEFAZOLIN SODIUM-DEXTROSE 2-4 GM/100ML-% IV SOLN
INTRAVENOUS | Status: AC
  Filled 2017-07-19: qty 100

## 2017-07-19 MED ORDER — CEFAZOLIN SODIUM-DEXTROSE 2-4 GM/100ML-% IV SOLN
2.0000 g | INTRAVENOUS | Status: AC
  Administered 2017-07-19: 2 g via INTRAVENOUS

## 2017-07-19 MED ORDER — IBUPROFEN 800 MG PO TABS: 800.0000 mg | ORAL_TABLET | Freq: Three times a day (TID) | ORAL | 0 refills | Status: DC | PRN

## 2017-07-19 MED ORDER — MEPERIDINE HCL 50 MG/ML IJ SOLN
INTRAMUSCULAR | Status: AC
  Filled 2017-07-19: qty 1

## 2017-07-19 MED ORDER — ROCURONIUM BROMIDE 50 MG/5ML IV SOSY
PREFILLED_SYRINGE | INTRAVENOUS | Status: DC | PRN
  Administered 2017-07-19: 50 mg via INTRAVENOUS
  Administered 2017-07-19: 10 mg via INTRAVENOUS

## 2017-07-19 MED ORDER — BUPIVACAINE HCL (PF) 0.5 % IJ SOLN
INTRAMUSCULAR | Status: DC | PRN
  Administered 2017-07-19: 25 mL

## 2017-07-19 MED ORDER — LIDOCAINE 2% (20 MG/ML) 5 ML SYRINGE
INTRAMUSCULAR | Status: DC | PRN
  Administered 2017-07-19: 50 mg via INTRAVENOUS

## 2017-07-19 MED ORDER — PROPOFOL 10 MG/ML IV BOLUS
INTRAVENOUS | Status: AC
  Filled 2017-07-19: qty 20

## 2017-07-19 MED ORDER — PROPOFOL 10 MG/ML IV BOLUS
INTRAVENOUS | Status: DC | PRN
  Administered 2017-07-19: 110 mg via INTRAVENOUS

## 2017-07-19 MED ORDER — MIDAZOLAM HCL 5 MG/5ML IJ SOLN
INTRAMUSCULAR | Status: DC | PRN
  Administered 2017-07-19 (×2): 1 mg via INTRAVENOUS

## 2017-07-19 MED ORDER — DEXAMETHASONE SODIUM PHOSPHATE 4 MG/ML IJ SOLN
INTRAMUSCULAR | Status: DC | PRN
  Administered 2017-07-19: 10 mg via INTRAVENOUS

## 2017-07-19 MED ORDER — KETAMINE HCL 10 MG/ML IJ SOLN
INTRAMUSCULAR | Status: AC
  Filled 2017-07-19: qty 1

## 2017-07-19 MED ORDER — FENTANYL CITRATE (PF) 100 MCG/2ML IJ SOLN
INTRAMUSCULAR | Status: DC | PRN
  Administered 2017-07-19: 50 ug via INTRAVENOUS
  Administered 2017-07-19: 100 ug via INTRAVENOUS

## 2017-07-19 MED ORDER — SENNA 8.6 MG PO TABS: 1.0000 | ORAL_TABLET | Freq: Every day | ORAL | 0 refills | Status: DC

## 2017-07-19 MED ORDER — SUGAMMADEX SODIUM 200 MG/2ML IV SOLN
INTRAVENOUS | Status: AC
  Filled 2017-07-19: qty 2

## 2017-07-19 MED ORDER — METOCLOPRAMIDE HCL 5 MG/ML IJ SOLN
INTRAMUSCULAR | Status: DC | PRN
  Administered 2017-07-19: 10 mg via INTRAVENOUS

## 2017-07-19 MED ORDER — MEPERIDINE HCL 50 MG/ML IJ SOLN
12.5000 mg | Freq: Once | INTRAMUSCULAR | Status: AC
  Administered 2017-07-19: 12.5 mg via INTRAVENOUS

## 2017-07-19 MED ORDER — MIDAZOLAM HCL 2 MG/2ML IJ SOLN
INTRAMUSCULAR | Status: AC
  Filled 2017-07-19: qty 2

## 2017-07-19 MED ORDER — SUGAMMADEX SODIUM 200 MG/2ML IV SOLN
INTRAVENOUS | Status: DC | PRN
Start: 2017-07-19 — End: 2017-07-19
  Administered 2017-07-19: 150 mg via INTRAVENOUS

## 2017-07-19 SURGICAL SUPPLY — 49 items
APPLICATOR SURGIFLO ENDO (HEMOSTASIS) IMPLANT
BAG LAPAROSCOPIC 12 15 PORT 16 (BASKET) IMPLANT
BAG RETRIEVAL 12/15 (BASKET)
COVER BACK TABLE 60X90IN (DRAPES) ×2 IMPLANT
COVER TIP SHEARS 8 DVNC (MISCELLANEOUS) ×1 IMPLANT
COVER TIP SHEARS 8MM DA VINCI (MISCELLANEOUS) ×1
DERMABOND ADVANCED (GAUZE/BANDAGES/DRESSINGS) ×1
DERMABOND ADVANCED .7 DNX12 (GAUZE/BANDAGES/DRESSINGS) ×1 IMPLANT
DRAPE ARM DVNC X/XI (DISPOSABLE) ×4 IMPLANT
DRAPE COLUMN DVNC XI (DISPOSABLE) ×1 IMPLANT
DRAPE DA VINCI XI ARM (DISPOSABLE) ×4
DRAPE DA VINCI XI COLUMN (DISPOSABLE) ×1
DRAPE SHEET LG 3/4 BI-LAMINATE (DRAPES) ×2 IMPLANT
DRAPE SURG IRRIG POUCH 19X23 (DRAPES) ×2 IMPLANT
ELECT REM PT RETURN 15FT ADLT (MISCELLANEOUS) ×2 IMPLANT
GLOVE BIO SURGEON STRL SZ 6 (GLOVE) ×8 IMPLANT
GLOVE BIO SURGEON STRL SZ 6.5 (GLOVE) ×4 IMPLANT
GOWN STRL REUS W/ TWL LRG LVL3 (GOWN DISPOSABLE) ×2 IMPLANT
GOWN STRL REUS W/TWL LRG LVL3 (GOWN DISPOSABLE) ×2
HOLDER FOLEY CATH W/STRAP (MISCELLANEOUS) ×2 IMPLANT
IRRIG SUCT STRYKERFLOW 2 WTIP (MISCELLANEOUS) ×2
IRRIGATION SUCT STRKRFLW 2 WTP (MISCELLANEOUS) ×1 IMPLANT
KIT PROCEDURE DA VINCI SI (MISCELLANEOUS) ×1
KIT PROCEDURE DVNC SI (MISCELLANEOUS) ×1 IMPLANT
MANIPULATOR UTERINE 4.5 ZUMI (MISCELLANEOUS) ×2 IMPLANT
NDL SAFETY ECLIPSE 18X1.5 (NEEDLE) ×1 IMPLANT
NEEDLE HYPO 18GX1.5 SHARP (NEEDLE) ×1
NEEDLE SPNL 18GX3.5 QUINCKE PK (NEEDLE) ×2 IMPLANT
OBTURATOR OPTICAL STANDARD 8MM (TROCAR) ×1
OBTURATOR OPTICAL STND 8 DVNC (TROCAR) ×1
OBTURATOR OPTICALSTD 8 DVNC (TROCAR) ×1 IMPLANT
PACK ROBOT GYN CUSTOM WL (TRAY / TRAY PROCEDURE) ×2 IMPLANT
PAD POSITIONING PINK XL (MISCELLANEOUS) ×2 IMPLANT
POUCH SPECIMEN RETRIEVAL 10MM (ENDOMECHANICALS) IMPLANT
SEAL CANN UNIV 5-8 DVNC XI (MISCELLANEOUS) ×4 IMPLANT
SEAL XI 5MM-8MM UNIVERSAL (MISCELLANEOUS) ×4
SET TRI-LUMEN FLTR TB AIRSEAL (TUBING) ×2 IMPLANT
SOLUTION ELECTROLUBE (MISCELLANEOUS) ×2 IMPLANT
SURGIFLO W/THROMBIN 8M KIT (HEMOSTASIS) IMPLANT
SUT VIC AB 0 CT1 27 (SUTURE)
SUT VIC AB 0 CT1 27XBRD ANTBC (SUTURE) IMPLANT
SUT VIC AB 3-0 SH 27 (SUTURE) ×1
SUT VIC AB 3-0 SH 27XBRD (SUTURE) ×1 IMPLANT
SYR 10ML LL (SYRINGE) ×2 IMPLANT
TOWEL OR NON WOVEN STRL DISP B (DISPOSABLE) ×2 IMPLANT
TRAP SPECIMEN MUCOUS 40CC (MISCELLANEOUS) IMPLANT
TRAY FOLEY W/METER SILVER 16FR (SET/KITS/TRAYS/PACK) ×2 IMPLANT
UNDERPAD 30X30 (UNDERPADS AND DIAPERS) ×2 IMPLANT
WATER STERILE IRR 1000ML POUR (IV SOLUTION) ×4 IMPLANT

## 2017-07-19 NOTE — Op Note (Signed)
OPERATIVE NOTE 07/19/17  Surgeon: Donaciano Eva   Assistants: Dr Lahoma Crocker (an MD assistant was necessary for tissue manipulation, management of robotic instrumentation, retraction and positioning due to the complexity of the case and hospital policies).   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)  Post-operative Diagnosis: same,  Operation: Robotic-assisted laparoscopic total hysterectomy with bilateral salpingectomy, SLN biopsy   Surgeon: Donaciano Eva  Assistant Surgeon: Lahoma Crocker MD  Anesthesia: GET  Urine Output: 300  Operative Findings:  : 10cm fibroid uterus, normal ovaries, no suspicious nodes  Estimated Blood Loss:  20cc      Total IV Fluids: 1,000 ml         Specimens: uterus, cervix, bilateral tubes, right external iliac SLN, right internal iliac SLN, right presacral SLN, left obturator SLN, left external iliac SLN         Complications:  None; patient tolerated the procedure well.         Disposition: PACU - hemodynamically stable.  Procedure Details  The patient was seen in the Holding Room. The risks, benefits, complications, treatment options, and expected outcomes were discussed with the patient.  The patient concurred with the proposed plan, giving informed consent.  The site of surgery properly noted/marked. The patient was identified as Leslie Deleon and the procedure verified as a Robotic-assisted hysterectomy with bilateral salpingo oophorectomy with SLN biopsy. A Time Out was held and the above information confirmed.  After induction of anesthesia, the patient was draped and prepped in the usual sterile manner. Pt was placed in supine position after anesthesia and draped and prepped in the usual sterile manner. The abdominal drape was placed after the CholoraPrep had been allowed to dry for 3 minutes.  Her arms were tucked to her side  with all appropriate precautions.  The shoulders were stabilized with padded shoulder blocks applied to the acromium processes.  The patient was placed in the semi-lithotomy position in [place].  The perineum was prepped with Betadine. The patient was then prepped. Foley catheter was placed.  A sterile speculum was placed in the vagina.  The cervix was grasped with a single-tooth tenaculum. 2mg  total of ICG was injected into the cervical stroma at 2 and 9 o'clock with 1cc injected at a 1cm and 30mm depth (concentration 0.5mg /ml) in all locations. The cervix was dilated with Kennon Rounds dilators.  The ZUMI uterine manipulator with a medium colpotomizer ring was placed without difficulty.  A pneum occluder balloon was placed over the manipulator.  OG tube placement was confirmed and to suction.   Next, a 5 mm skin incision was made 1 cm below the subcostal margin in the midclavicular line.  The 5 mm Optiview port and scope was used for direct entry.  Opening pressure was under 10 mm CO2.  The abdomen was insufflated and the findings were noted as above.   At this point and all points during the procedure, the patient's intra-abdominal pressure did not exceed 15 mmHg. Next, a 10 mm skin incision was made in the umbilicus and a right and left port was placed about 10 cm lateral to the robot port on the right and left side.  A fourth arm was placed in the left lower quadrant 2 cm above and superior and medial to the anterior superior iliac spine.  All ports were placed under direct visualization.  The patient was placed in steep Trendelenburg.  Bowel was  folded away into the upper abdomen.  The robot was docked in the normal manner.  The right and left peritoneum were opened parallel to the IP ligament to open the retroperitoneal spaces bilaterally. The SLN mapping was performed in bilateral pelvic basins. The para rectal and paravesical spaces were opened up entirely with careful dissection below the level of the  ureters bilaterally and to the depth of the uterine artery origin in order to skeletonize the uterine "web" and ensure visualization of all parametrial channels. The para-aortic basins were carefully exposed and evaluated for isolated para-aortic SLN's. Lymphatic channels were identified travelling to the following visualized sentinel lymph node's: right external iliac SLN, right internal iliac SLN, right presacral SLN, left obturator SLN, left external iliac SLN. These SLN's were separated from their surrounding lymphatic tissue, removed and sent for permanent pathology.  The hysterectomy was started after the round ligament on the right side was incised and the retroperitoneum was entered and the pararectal space was developed.  The ureter was noted to be on the medial leaf of the broad ligament.  The peritoneum above the ureter was incised and stretched and the infundibulopelvic ligament was skeletonized, cauterized and cut.  The posterior peritoneum was taken down to the level of the KOH ring.  The anterior peritoneum was also taken down.  The bladder flap was created to the level of the KOH ring.  The uterine artery on the right side was skeletonized, cauterized and cut in the normal manner.  A similar procedure was performed on the left.  The colpotomy was made and the uterus, cervix, bilateral ovaries and tubes were amputated and delivered through the vagina.  Pedicles were inspected and excellent hemostasis was achieved.    The colpotomy at the vaginal cuff was closed with Vicryl on a CT1 needle in a figure of 8 manner.  Irrigation was used and excellent hemostasis was achieved.  At this point in the procedure was completed.  Robotic instruments were removed under direct visulaization.  The robot was undocked. The 10 mm ports were closed with Vicryl on a UR-5 needle and the fascia was closed with 0 Vicryl on a UR-5 needle.  The skin was closed with 4-0 Vicryl in a subcuticular manner.  Dermabond was  applied.  Sponge, lap and needle counts correct x 2.  The patient was taken to the recovery room in stable condition.  The vagina was swabbed with  minimal bleeding noted.   All instrument and needle counts were correct x  3.   The patient was transferred to the recovery room in a stable condition.  Donaciano Eva, MD

## 2017-07-19 NOTE — Interval H&P Note (Signed)
History and Physical Interval Note:  07/19/2017 7:14 AM  Leslie Deleon  has presented today for surgery, with the diagnosis of ENDOMETRIAL CANCER  The various methods of treatment have been discussed with the patient and family. After consideration of risks, benefits and other options for treatment, the patient has consented to  Procedure(s): XI ROBOTIC ASSISTED TOTAL HYSTERECTOMY WITH BILATERAL SALPINGECTOMY (N/A) SENTINEL NODE BIOPSY (N/A) as a surgical intervention .  The patient's history has been reviewed, patient examined, no change in status, stable for surgery.  I have reviewed the patient's chart and labs.  Questions were answered to the patient's satisfaction.     Donaciano Eva

## 2017-07-19 NOTE — Transfer of Care (Signed)
Immediate Anesthesia Transfer of Care Note  Patient: Pin-Pin Pletz  Procedure(s) Performed: Procedure(s): XI ROBOTIC ASSISTED TOTAL HYSTERECTOMY WITH BILATERAL SALPINGECTOMY (N/A) SENTINEL NODE BIOPSY (N/A)  Patient Location: PACU  Anesthesia Type:General  Level of Consciousness: Patient easily awoken, sedated, comfortable, cooperative, following commands, responds to stimulation.   Airway & Oxygen Therapy: Patient spontaneously breathing, ventilating well, oxygen via simple oxygen mask.  Post-op Assessment: Report given to PACU RN, vital signs reviewed and stable, moving all extremities.   Post vital signs: Reviewed and stable.  Complications: No apparent anesthesia complications  Last Vitals:  Vitals:   07/19/17 0523  BP: 125/90  Pulse: 83  Resp: 16  Temp: 36.9 C  SpO2: 98%    Last Pain:  Vitals:   07/19/17 0523  TempSrc: Oral      Patients Stated Pain Goal: 4 (58/83/25 [id number])  Complications: No apparent anesthesia complications

## 2017-07-19 NOTE — Anesthesia Procedure Notes (Signed)
Procedure Name: Intubation Date/Time: 07/19/2017 7:48 AM Performed by: Deliah Boston, CRNA Pre-anesthesia Checklist: Patient identified, Emergency Drugs available, Suction available and Patient being monitored Patient Re-evaluated:Patient Re-evaluated prior to induction Oxygen Delivery Method: Circle system utilized Preoxygenation: Pre-oxygenation with 100% oxygen Induction Type: IV induction Ventilation: Mask ventilation without difficulty Laryngoscope Size: Mac and 3 Grade View: Grade III Tube type: Oral Tube size: 7.0 mm Number of attempts: 1 Airway Equipment and Method: Stylet and Oral airway Placement Confirmation: ETT inserted through vocal cords under direct vision,  positive ETCO2 and breath sounds checked- equal and bilateral Secured at: 20 cm Tube secured with: Tape Dental Injury: Teeth and Oropharynx as per pre-operative assessment  Difficulty Due To: Difficulty was unanticipated and Difficult Airway- due to anterior larynx

## 2017-07-19 NOTE — Discharge Instructions (Signed)
07/19/2017  Return to work: 4 weeks  Activity: 1. Be up and out of the bed during the day.  Take a nap if needed.  You may walk up steps but be careful and use the hand rail.  Stair climbing will tire you more than you think, you may need to stop part way and rest.   2. No lifting or straining for 6 weeks.  3. No driving for 1 weeks.  Do Not drive if you are taking narcotic pain medicine.  4. Shower daily.  Use soap and water on your incision and pat dry; don't rub.   5. No sexual activity and nothing in the vagina for 8 weeks.  Medications:  - Take ibuprofen and tylenol first line for pain control. Take these regularly (every 6 hours) to decrease the build up of pain.  - If necessary, for severe pain not relieved by ibuprofen, take percocet.  - While taking percocet you should take sennakot every night to reduce the likelihood of constipation. If this causes diarrhea, stop its use.  Diet: 1. Low sodium Heart Healthy Diet is recommended.  2. It is safe to use a laxative if you have difficulty moving your bowels.   Wound Care: 1. Keep clean and dry.  Shower daily.  Reasons to call the Doctor:   Fever - Oral temperature greater than 100.4 degrees Fahrenheit  Foul-smelling vaginal discharge  Difficulty urinating  Nausea and vomiting  Increased pain at the site of the incision that is unrelieved with pain medicine.  Difficulty breathing with or without chest pain  New calf pain especially if only on one side  Sudden, continuing increased vaginal bleeding with or without clots.   Follow-up: 1. See Everitt Amber in 3-4 weeks.  Contacts: For questions or concerns you should contact:  Dr. Everitt Amber at [phone] After hours and on week-ends call [phone] and ask to speak to the physician on call for Gynecologic Oncology   General Anesthesia, Adult, Care After These instructions provide you with information about caring for yourself after your procedure. Your  health care provider may also give you more specific instructions. Your treatment has been planned according to current medical practices, but problems sometimes occur. Call your health care provider if you have any problems or questions after your procedure. What can I expect after the procedure? After the procedure, it is common to have:  Vomiting.  A sore throat.  Mental slowness.  It is common to feel:  Nauseous.  Cold or shivery.  Sleepy.  Tired.  Sore or achy, even in parts of your body where you did not have surgery.  Follow these instructions at home: For at least 24 hours after the procedure:  Do not: ? Participate in activities where you could fall or become injured. ? Drive. ? Use heavy machinery. ? Drink alcohol. ? Take sleeping pills or medicines that cause drowsiness. ? Make important decisions or sign legal documents. ? Take care of children on your own.  Rest. Eating and drinking  If you vomit, drink water, juice, or soup when you can drink without vomiting.  Drink enough fluid to keep your urine clear or pale yellow.  Make sure you have little or no nausea before eating solid foods.  Follow the diet recommended by your health care provider. General instructions  Have a responsible adult stay with you until you are awake and alert.  Return to your normal activities as told by your health care provider. Ask  your health care provider what activities are safe for you.  Take over-the-counter and prescription medicines only as told by your health care provider.  If you smoke, do not smoke without supervision.  Keep all follow-up visits as told by your health care provider. This is important. Contact a health care provider if:  You continue to have nausea or vomiting at home, and medicines are not helpful.  You cannot drink fluids or start eating again.  You cannot urinate after 8-12 hours.  You develop a skin rash.  You have fever.  You  have increasing redness at the site of your procedure. Get help right away if:  You have difficulty breathing.  You have chest pain.  You have unexpected bleeding.  You feel that you are having a life-threatening or urgent problem. This information is not intended to replace advice given to you by your health care provider. Make sure you discuss any questions you have with your health care provider. Document Released: 11/15/2000 Document Revised: 01/12/2016 Document Reviewed: 07/24/2015 Elsevier Interactive Patient Education  [employer].

## 2017-07-19 NOTE — Anesthesia Preprocedure Evaluation (Signed)
Anesthesia Evaluation  Patient identified by MRN, date of birth, ID band Patient awake    Reviewed: Allergy & Precautions, NPO status , Patient's Chart, lab work & pertinent test results  Airway Mallampati: II  TM Distance: >3 FB Neck ROM: Full    Dental no notable dental hx.    Pulmonary neg pulmonary ROS,    Pulmonary exam normal breath sounds clear to auscultation       Cardiovascular negative cardio ROS Normal cardiovascular exam Rhythm:Regular Rate:Normal     Neuro/Psych negative neurological ROS  negative psych ROS   GI/Hepatic negative GI ROS, Neg liver ROS,   Endo/Other  negative endocrine ROS  Renal/GU negative Renal ROS  negative genitourinary   Musculoskeletal negative musculoskeletal ROS (+)   Abdominal   Peds negative pediatric ROS (+)  Hematology negative hematology ROS (+)   Anesthesia Other Findings   Reproductive/Obstetrics negative OB ROS                             Anesthesia Physical Anesthesia Plan  ASA: II  Anesthesia Plan: General   Post-op Pain Management:    Induction: Intravenous  PONV Risk Score and Plan: 3 and Ondansetron, Dexamethasone, Treatment may vary due to age or medical condition and Midazolam  Airway Management Planned: Oral ETT  Additional Equipment:   Intra-op Plan:   Post-operative Plan: Extubation in OR  Informed Consent: I have reviewed the patients History and Physical, chart, labs and discussed the procedure including the risks, benefits and alternatives for the proposed anesthesia with the patient or authorized representative who has indicated his/her understanding and acceptance.     Dental advisory given  Plan Discussed with: CRNA and Surgeon  Anesthesia Plan Comments:         Anesthesia Quick Evaluation  

## 2017-07-20 NOTE — Anesthesia Postprocedure Evaluation (Signed)
Anesthesia Post Note  Patient: Leslie Deleon  Procedure(s) Performed: XI ROBOTIC ASSISTED TOTAL HYSTERECTOMY WITH BILATERAL SALPINGECTOMY (N/A ) SENTINEL NODE BIOPSY (N/A )     Patient location during evaluation: PACU Anesthesia Type: General Level of consciousness: awake and alert Pain management: pain level controlled Vital Signs Assessment: post-procedure vital signs reviewed and stable Respiratory status: spontaneous breathing, nonlabored ventilation, respiratory function stable and patient connected to nasal cannula oxygen Cardiovascular status: blood pressure returned to baseline and stable Postop Assessment: no apparent nausea or vomiting Anesthetic complications: no    Last Vitals:  Vitals:   07/19/17 1045 07/19/17 1115  BP: 125/72 128/71  Pulse: 78 92  Resp: 15 18  Temp: 36.7 C 36.6 C  SpO2: 97% 99%    Last Pain:  Vitals:   07/19/17 1115  TempSrc: Oral  PainSc: 3                  Nuh Lipton S

## 2017-07-22 ENCOUNTER — Telehealth: Payer: Self-pay | Admitting: Gynecologic Oncology

## 2017-07-22 NOTE — Telephone Encounter (Signed)
Patient informed of final path results.  Reporting mild erythema at the incision above the umbilicus.  No drainage, no increased warmth.  Advised to monitor and to call if it has not resolved, if it worsens, or begins draining.  All questions answered.  Advised to call for any needs.

## 2017-07-25 ENCOUNTER — Telehealth: Payer: Self-pay

## 2017-07-25 NOTE — Telephone Encounter (Signed)
Ns Pin -Pin states that her incision above the umbilicus is less red than Friday. It is not tender or warm to the touch. Afebrile. Leslie Deleon is concerned that the incision is raised and not flat like the other incisions. Reviewed with Melissa Cross,NP.   Told Leslie Deleon that she can apply moist heat the the area qid.  If the site remains raised or redness increases, she is to call to be seen by [name]. Pt verbalized understanding and is comfortable with this plan.

## 2017-07-29 ENCOUNTER — Telehealth: Payer: Self-pay

## 2017-07-29 NOTE — Telephone Encounter (Signed)
Leslie Deleon states that she began with some yellow vaginal drainage yesterday.  It is a small amount on panty liner and after she uses the rest room.   Afebrile, no pain, and no foul odor. Reviewed with [name], NP. The sutures are dissolving  And can cause a brownish yellow drainage. Intermittently with in 4-6 weeks of surgery. She is to call if she develops vaginal bleeding like a period or copious amounts of yellow or white drainage  with or with out a foul odor. Pt verbalized understanding.

## 2017-08-02 ENCOUNTER — Other Ambulatory Visit (HOSPITAL_BASED_OUTPATIENT_CLINIC_OR_DEPARTMENT_OTHER): Payer: Commercial Managed Care - PPO

## 2017-08-02 ENCOUNTER — Telehealth: Payer: Self-pay | Admitting: Gynecologic Oncology

## 2017-08-02 DIAGNOSIS — R300 Dysuria: Secondary | ICD-10-CM | POA: Diagnosis not present

## 2017-08-02 LAB — URINALYSIS, MICROSCOPIC - CHCC
BILIRUBIN (URINE): NEGATIVE
GLUCOSE UR CHCC: NEGATIVE mg/dL
Ketones: NEGATIVE mg/dL
Leukocyte Esterase: NEGATIVE
Nitrite: NEGATIVE
Protein: NEGATIVE mg/dL
SPECIFIC GRAVITY, URINE: 1.02 (ref 1.003–1.035)
UROBILINOGEN UR: 0.2 mg/dL (ref 0.2–1)
WBC, UA: NEGATIVE (ref 0–?)
pH: 5 (ref 4.6–8.0)

## 2017-08-02 NOTE — Telephone Encounter (Signed)
Patient returned call.  Patient stating she does not have any low grade temps but she continues to have mild dysuria at the end of her void.  Advised to come in and give a urine sample for culture.  Patient states her abdominal incisions are healing but they will also be assessed by myself when she comes in to the lab.

## 2017-08-02 NOTE — Telephone Encounter (Signed)
Returned call to patient.  She had left message reporting low grade temp of 99.0 since Friday.  Advised her to please call the office to discuss.

## 2017-08-03 ENCOUNTER — Telehealth: Payer: Self-pay

## 2017-08-03 LAB — URINE CULTURE

## 2017-08-03 NOTE — Telephone Encounter (Signed)
Told Leslie Deleon that the U/A does not looked like she has a UTI . Will wait on the urine culture results.to come back per [name], NP.   This will be Friday or Monday of next week.  Pt requested a copy of her pathology report to review. A copy will be up front for her to pick up later this week.

## 2017-08-04 NOTE — Telephone Encounter (Signed)
LM for Ms Swartzendruber that the urine culture did not show any infection per [name], NP.  She can call back if she has any questions or concerns.

## 2017-08-17 ENCOUNTER — Encounter: Payer: Self-pay | Admitting: Gynecologic Oncology

## 2017-08-17 ENCOUNTER — Ambulatory Visit: Payer: Commercial Managed Care - PPO | Attending: Gynecologic Oncology | Admitting: Gynecologic Oncology

## 2017-08-17 VITALS — BP 123/76 | HR 67 | Temp 98.5°F | Resp 18 | Ht 61.0 in | Wt 128.4 lb

## 2017-08-17 DIAGNOSIS — Z90710 Acquired absence of both cervix and uterus: Secondary | ICD-10-CM

## 2017-08-17 DIAGNOSIS — N8502 Endometrial intraepithelial neoplasia [EIN]: Secondary | ICD-10-CM | POA: Insufficient documentation

## 2017-08-17 DIAGNOSIS — Z48816 Encounter for surgical aftercare following surgery on the genitourinary system: Secondary | ICD-10-CM | POA: Insufficient documentation

## 2017-08-17 DIAGNOSIS — Z9079 Acquired absence of other genital organ(s): Secondary | ICD-10-CM | POA: Insufficient documentation

## 2017-08-17 DIAGNOSIS — N8501 Benign endometrial hyperplasia: Secondary | ICD-10-CM

## 2017-08-17 NOTE — Patient Instructions (Signed)
Plan to follow up with your GYN annually.  Please call for any needs.

## 2017-08-17 NOTE — Progress Notes (Signed)
  POSTOPERATIVE FOLLOW-UP  HPI:  Leslie Deleon is a 56 y.o. year old initially seen in consultation on 06/27/17 for CAH.  She then underwent a robotic hysterectomy, Bilateral salpingectomy, SLN biopsy on 83/38/25 without complications.  Her postoperative course was uncomplicated.  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  She is seen today for a postoperative check and to discuss her pathology results and ongoing plan.  Since discharge from the hospital, she is feeling well.  She has improving appetite, normal bowel and bladder function, and pain controlled with minimal PO medication. She has no other complaints today.  Review of systems: Constitutional:  She has no weight gain or weight loss. She has no fever or chills. Eyes: No blurred vision Ears, Nose, Mouth, Throat: No dizziness, headaches or changes in hearing. No mouth sores. Cardiovascular: No chest pain, palpitations or edema. Respiratory:  No shortness of breath, wheezing or cough Gastrointestinal: She has normal bowel movements without diarrhea or constipation. She denies any nausea or vomiting. She denies blood in her stool or heart burn. Genitourinary:  She denies pelvic pain, pelvic pressure or changes in her urinary function. She has no hematuria, dysuria, or incontinence. She has no irregular vaginal bleeding or vaginal discharge Musculoskeletal: Denies muscle weakness or joint pains.  Skin:  She has no skin changes, rashes or itching Neurological:  Denies dizziness or headaches. No neuropathy, no numbness or tingling. Psychiatric:  She denies depression or anxiety. Hematologic/Lymphatic:   No easy bruising or bleeding   Physical Exam: Blood pressure 123/76, pulse 67, temperature 98.5 F (36.9 C), temperature source Oral, resp. rate 18, height 5\' 1"  (1.549 m), weight 128 lb 6.4 oz (58.2 kg), SpO2 98 %. General: Well dressed, well nourished in no apparent distress.    HEENT:  Normocephalic and atraumatic, no lesions.  Extraocular muscles intact. Sclerae anicteric. Pupils equal, round, reactive. No mouth sores or ulcers. Thyroid is normal size, not nodular, midline. Skin:  No lesions or rashes. Breasts:  Soft, symmetric.  No skin or nipple changes.  No palpable LN or masses. Lungs:  Clear to auscultation bilaterally.  No wheezes. Cardiovascular:  Regular rate and rhythm.  No murmurs or rubs. Abdomen:  Soft, nontender, nondistended.  No palpable masses.  No hepatosplenomegaly.  No ascites. Normal bowel sounds.  No hernias.  Incisions are well healed  Genitourinary: Normal EGBUS  Vaginal cuff intact.  No bleeding or discharge.  No cul de sac fullness. Extremities: No cyanosis, clubbing or edema.  No calf tenderness or erythema. No palpable cords. Psychiatric: Mood and affect are appropriate. Neurological: Awake, alert and oriented x 3. Sensation is intact, no neuropathy.  Musculoskeletal: No pain, normal strength and range of motion.  Assessment:    56 y.o. year old with a history of CAH.   S/p hysterectomy, Bilateral salpingectomy, SLN biopsy on 07/19/17.   Plan: 1) Pathology reports reviewed today 2) Treatment counseling - no further treatment recommended. She was given the opportunity to ask questions, which were answered to her satisfaction, and she is agreement with the above mentioned plan of care.  3)  Return to clinic on a prn basis. She will follow-up with Dr Marvel Plan for well woman visits annually.

## 2017-08-29 ENCOUNTER — Telehealth: Payer: Self-pay | Admitting: Gynecologic Oncology

## 2017-08-29 NOTE — Telephone Encounter (Signed)
Returned call to patient.  All questions answered.  She was asking if she still had her ovaries because she received a bill from insurance that had BSO listed.  Also asking about what each incision site was for.  Advised to call for any further questions.

## 2017-10-17 ENCOUNTER — Telehealth: Payer: Self-pay | Admitting: Gynecologic Oncology

## 2017-10-17 NOTE — Telephone Encounter (Signed)
Returned call to patient.  She is concerned because she received a bill for Dr. Myrtie Soman with anesthesiology stating that he was out of network.  Advised to reach out to his office at [phone].  All questions answered.  Reached out to our prior auth department again to follow up on denied surgical claim.  Patient advised to call for any needs or concerns.

## 2017-10-26 ENCOUNTER — Ambulatory Visit (INDEPENDENT_AMBULATORY_CARE_PROVIDER_SITE_OTHER): Payer: Commercial Managed Care - PPO | Admitting: Ophthalmology

## 2017-10-26 DIAGNOSIS — H2513 Age-related nuclear cataract, bilateral: Secondary | ICD-10-CM | POA: Diagnosis not present

## 2017-10-26 DIAGNOSIS — H43813 Vitreous degeneration, bilateral: Secondary | ICD-10-CM

## 2017-10-26 DIAGNOSIS — H5213 Myopia, bilateral: Secondary | ICD-10-CM | POA: Diagnosis not present

## 2017-10-26 DIAGNOSIS — H33303 Unspecified retinal break, bilateral: Secondary | ICD-10-CM

## 2017-11-04 ENCOUNTER — Telehealth: Payer: Self-pay | Admitting: *Deleted

## 2017-11-04 NOTE — Telephone Encounter (Signed)
Returned the patient's call, left message to call the office back at her convenience.

## 2017-11-08 ENCOUNTER — Other Ambulatory Visit: Payer: Self-pay | Admitting: Gynecologic Oncology

## 2017-11-08 ENCOUNTER — Telehealth: Payer: Self-pay | Admitting: *Deleted

## 2017-11-08 DIAGNOSIS — R102 Pelvic and perineal pain: Secondary | ICD-10-CM

## 2017-11-08 NOTE — Telephone Encounter (Addendum)
Patient called and requested an [place] scan. Patient stated " I have had pain since January getting worse  with signs and symptoms of menopause. Pain happens when I stand up straight. Pain level was a 3 in January but now is at at 6. I have no bleeding, discharge, odor or cramping." Per Melissa APP schedule [place] scan.  Called and gave patient the information for the [place] scan.

## 2017-11-08 NOTE — Progress Notes (Signed)
Patient called and spoke with Sharyn Lull, AA/NT reporting "bad pelvic pain and cramping" along with signs of menopause.  She stated the pain started in mid Jan and was around a level 3 but now is rated around 6.  Cramping on her left pelvic region.  Light discharge but no odor.  No bleeding reported.  Taking Aleve.  She is concerned about her ovaries.  Plan to order an [place] to evaluate for ovarian cyst/mass.

## 2017-11-11 ENCOUNTER — Ambulatory Visit (HOSPITAL_COMMUNITY)
Admission: RE | Admit: 2017-11-11 | Discharge: 2017-11-11 | Disposition: A | Discharge status: Home / Self Care | Payer: Commercial Managed Care - PPO | Source: Ambulatory Visit | Attending: Gynecologic Oncology | Admitting: Gynecologic Oncology

## 2017-11-11 DIAGNOSIS — R102 Pelvic and perineal pain: Secondary | ICD-10-CM | POA: Insufficient documentation

## 2017-11-11 DIAGNOSIS — Z90722 Acquired absence of ovaries, bilateral: Secondary | ICD-10-CM | POA: Diagnosis not present

## 2017-11-11 DIAGNOSIS — Z9079 Acquired absence of other genital organ(s): Secondary | ICD-10-CM | POA: Insufficient documentation

## 2017-11-11 DIAGNOSIS — Z90710 Acquired absence of both cervix and uterus: Secondary | ICD-10-CM | POA: Insufficient documentation

## 2017-11-15 ENCOUNTER — Telehealth: Payer: Self-pay | Admitting: Gynecologic Oncology

## 2017-11-15 NOTE — Telephone Encounter (Signed)
Called patient and discussed US findings.  She states her pelvic pain has lessened some.  All questions answered.  Advised that the [place] will be reviewed by Dr. Denman George next week and she will be contacted with any further recommendations if any.  Advised to call for any needs.

## 2017-11-25 ENCOUNTER — Telehealth: Payer: Self-pay | Admitting: Gynecologic Oncology

## 2017-11-25 NOTE — Telephone Encounter (Signed)
Patient states that her pain is still present in the pelvic region but it is better compared to last week.  She states it is constantly there and started at the end of Jan.  Discussed [place] results again.  Advised patient she can come into the office for an evaluation with Dr. Denman George.  She did not know if there would be a significant benefit with that since she had a negative [place].  Denies vaginal bleeding or discharge.  Asked if the pain came on after intercourse but the patient denies that.  She would like to monitor her symptoms and will call the office if it persists or worsens.  All questions answered about symptoms of menopause.  Also advised patient that if she felt strongly about confirming that her ovaries were not removed, Dr. Denman George offered to take her to the OR for a diag lap to confirm the presence of her ovaries.  The patient felt that was too much and unnecessary. She is advised to call for any needs.

## 2017-12-12 DIAGNOSIS — M79641 Pain in right hand: Secondary | ICD-10-CM | POA: Diagnosis not present

## 2017-12-12 DIAGNOSIS — M65331 Trigger finger, right middle finger: Secondary | ICD-10-CM | POA: Diagnosis not present

## 2017-12-16 ENCOUNTER — Ambulatory Visit: Payer: Commercial Managed Care - PPO | Admitting: Family Medicine

## 2017-12-20 ENCOUNTER — Telehealth: Payer: Self-pay | Admitting: Gynecologic Oncology

## 2017-12-20 NOTE — Telephone Encounter (Signed)
Called patient to discuss her concerns regarding surgery.  Informed by my NP that the patient has concerns that her ovaries were removed during surgery. I had attempted to reassure her both in a prior phone call, as well as other phone calls through my NP, that this was not the case.  The initial concern came when she obtained a copy of her operative report for insurance purposes. The original op note had stated that a BSO was performed (which was a reporting error from use of a hysterectomy, BSO template which was not ammended in real time to reflect the accuract procedure). However, there was documentation on the pathology reacquisition and pathology report that no ovaries were removed or obtained by pathology. The patient has had continued ongoing anxiety and concern that the ovaries were removed. Of note, she is also very upset about hospital bills which are not covered by insurance. Postop [place] had failed to identify ovaries, which is expected as her ovaries are postmenopausal size and normal, and would not be likely to be visible on [place].  In a response to the patient, my NP had suggested that I would be happy to perform a diagnostic laparoscopy to photograph the ovaries, if this would make her feel reassured. I was not recommending this procedure, as I personally do not need to view the ovaries which I know to remain in situ, however I would be willing to offer this for the patient if she feels that she would like that level of reassurance.  The patient expressed to me in today's call that she is very angry. "how would you feel if your doctor was recommending another surgery just to find out what they had done".  I explained that I did know what I had done, but had offered the surgery as a means for [facility] to feel more comfortable. Clearly she is not interested in this. I asked her what I could do to make her feel more reassured.   She expressed at length that she doesn't feel that I adequately  informed her of the steps of the procedure (specifically where the incisions would be, how the robot would be brought in, how I would separate attachments to the ovaries, how I would use suture etc). She feels that I didn't explain the alternative to surgery (IUD) adequately. She feels that both Dr Marvel Plan and I felt that since she was postmenopausal it was "no big deal and I should just have it removed and just shut up". (it should be noted that at no time did I use this sort of language with the patient).  She feels that when I called her postop to explain that there was an error in the op note and that her ovaries were not removed, that I didn't "admit that a mistake had happened". Although she does acknowledge that I did call her to tell her that there was an error in the op note. She expressed that she doesn't know what other mistakes might have happened.   I sincerely and deeply apologized for the fact that she feels inadequately prepared for her surgery and that there was a mistake in filling out the postop note template that suggested that her ovaries had been removed. She was on an international call and therefore wanted to end the call. I have offered that she come meet with me for an hour in the office (not a clinical visit for which she could be billed). We will use that opportunity to discuss the  error that occurred in filling out the original op note, as well as explore how she felt about her preparedness for surgery.  Thereasa Solo, MD

## 2018-01-04 ENCOUNTER — Ambulatory Visit: Payer: Commercial Managed Care - PPO | Admitting: Family Medicine

## 2018-01-11 ENCOUNTER — Telehealth: Payer: Self-pay

## 2018-01-11 NOTE — Telephone Encounter (Signed)
Told Leslie Deleon that Dr. Denman George is able to meet with her next Friday 01-20-18 at 0900. She does not need to register.  Come to the the small waiting room as she does for visits with Dr. Denman George.  One of the GYN staff will get her back to meet with Dr. Denman George. Pt appreciative of appointment  And verbalized understanding.

## 2018-01-23 ENCOUNTER — Ambulatory Visit: Payer: Commercial Managed Care - PPO | Admitting: Family Medicine

## 2018-01-27 ENCOUNTER — Encounter: Payer: Self-pay | Admitting: Family Medicine

## 2018-01-27 ENCOUNTER — Other Ambulatory Visit: Payer: Self-pay

## 2018-01-27 ENCOUNTER — Ambulatory Visit (INDEPENDENT_AMBULATORY_CARE_PROVIDER_SITE_OTHER): Payer: Commercial Managed Care - PPO | Admitting: Family Medicine

## 2018-01-27 VITALS — BP 132/80 | HR 50 | Temp 97.9°F | Ht 63.0 in | Wt 127.2 lb

## 2018-01-27 DIAGNOSIS — Z8742 Personal history of other diseases of the female genital tract: Secondary | ICD-10-CM

## 2018-01-27 DIAGNOSIS — Z23 Encounter for immunization: Secondary | ICD-10-CM | POA: Diagnosis not present

## 2018-01-27 DIAGNOSIS — Z0000 Encounter for general adult medical examination without abnormal findings: Secondary | ICD-10-CM

## 2018-01-27 NOTE — Progress Notes (Signed)
Subjective  CC:  Chief Complaint  Patient presents with  . Establish Care    Transfer from [facility], Last Physical 01/24/2017  . Discuss Hormones    HPI: Leslie Deleon is a 57 y.o. female who presents to [place] at [facility] today to establish care with me as a new patient. Preferred name is Leslie Deleon.  She has the following concerns or needs:  Very happy healthy 57 yo female s/p hysterectomy with bilateral salpingectomy (ovaries intact) for atypical endometrial hyperplasia. Has questions about this but overall has recovered well. No postmenopausal sxs now. Healthy lifestyle. Married and has one grown daughter.   Assessment  1. Annual physical exam   2. History of endometrial hyperplasia s/p hysterectomy and bil salpingectomy      Plan  Female Wellness Visit:  Age appropriate Health Maintenance and Prevention measures were discussed with patient. Included topics are cancer screening recommendations, ways to keep healthy (see AVS) including dietary and exercise recommendations, regular eye and dental care, use of seat belts, and avoidance of moderate alcohol use and tobacco use.   BMI: discussed patient's BMI and encouraged positive lifestyle modifications to help get to or maintain a target BMI.  HM needs and immunizations were addressed and ordered. See below for orders. See HM and immunization section for updates. Tdap today  Routine labs and screening tests ordered including cmp, cbc and lipids where appropriate. To be done at work - Bernita Raisin, [employer] will send me the results.   Discussed recommendations regarding Vit D and calcium supplementation (see AVS)    Follow up:  Return in about 1 year (around 01/28/2019) for complete physical. Orders Placed This Encounter  Procedures  . HM MAMMOGRAPHY  . Tdap vaccine greater than or equal to 7yo IM  . HM COLONOSCOPY   No orders of the defined types were placed in this encounter.      We updated and reviewed the patient's past history in detail and it is documented below.  Patient Active Problem List   Diagnosis Date Noted  . History of endometrial hyperplasia s/p hysterectomy and bil salpingectomy 06/27/2017   Health Maintenance  Topic Date Due  . Hepatitis C Screening  01/28/2019 (Originally 01-Mar-1961)  . HIV Screening  01/28/2019 (Originally 10/27/1975)  . INFLUENZA VACCINE  03/23/2018  . MAMMOGRAM  06/01/2018  . COLONOSCOPY  11/22/2023  . TETANUS/TDAP  01/28/2028  . PAP SMEAR  Discontinued   Immunization History  Administered Date(s) Administered  . Tdap 01/27/2018   Current Meds  Medication Sig  . Multiple Vitamins-Minerals (MULTIVITAMIN PO) Take 1 tablet daily by mouth.  . Omega-3 Fatty Acids (FISH OIL) 1000 MG CAPS Take 2,000 mg daily by mouth.    Allergies: Patient has No Known Allergies. Past Medical History Patient  has a past medical history of Endometrial cancer ([place]) and Kidney infection (age [age]). Past Surgical History Patient  has a past surgical history that includes Cesarean section; Dilatation & curettage/hysteroscopy with myosure; Hysteroscopy (11/26/2015); Colonoscopy (2015/2016); Robotic assisted total hysterectomy with bilateral salpingo oophorectomy (N/A, 07/19/2017); and Sentinel node biopsy (N/A, 07/19/2017). Family History: Patient family history includes Bipolar disorder in her brother; Depression in her brother; Diabetes in her maternal grandmother and mother; Healthy in her daughter; Hepatitis B in her father; Hypertension in her father and mother; Kidney disease in her son; Liver cancer in her father; Suicidality in her brother. Social History:  Patient  reports that she has never smoked. She has never used smokeless tobacco.  She reports that she does not drink alcohol or use drugs.  Review of Systems: Constitutional: negative for fever or malaise Ophthalmic: negative for photophobia, double vision or loss of  vision Cardiovascular: negative for chest pain, dyspnea on exertion, or new LE swelling Respiratory: negative for SOB or persistent cough Gastrointestinal: negative for abdominal pain, change in bowel habits or melena Genitourinary: negative for dysuria or gross hematuria Musculoskeletal: negative for new gait disturbance or muscular weakness Integumentary: negative for new or persistent rashes Neurological: negative for TIA or stroke symptoms Psychiatric: negative for SI or delusions Allergic/Immunologic: negative for hives  Patient Care Team    Relationship Specialty Notifications Start End  Archie Balboa, MD PCP - General Internal Medicine  06/27/17   Paula Compton, MD Consulting Physician Obstetrics and Gynecology  01/27/18     Objective  Vitals: BP 132/80   Pulse (!) 50   Temp 97.9 F (36.6 C)   Ht 5\' 3"  (1.6 m)   Wt 127 lb 3.2 oz (57.7 kg)   LMP 05/03/2017   BMI 22.53 kg/m  General:  Well developed, well nourished, no acute distress  Psych:  Alert and oriented,normal mood and affect HEENT:  Normocephalic, atraumatic, non-icteric sclera, PERRL, oropharynx is without mass or exudate, supple neck without adenopathy, mass or thyromegaly Cardiovascular:  RRR without gallop, rub or murmur, nondisplaced PMI Respiratory:  Good breath sounds bilaterally, CTAB with normal respiratory effort Gastrointestinal: normal bowel sounds, soft, non-tender, no noted masses. No HSM MSK: no deformities, contusions. Joints are without erythema or swelling Skin:  Warm, no rashes or suspicious lesions noted Neurologic:    Mental status is normal. Gross motor and sensory exams are normal. Normal gait   Commons side effects, risks, benefits, and alternatives for medications and treatment plan prescribed today were discussed, and the patient expressed understanding of the given instructions. Patient is instructed to call or message via MyChart if he/she has any questions or concerns regarding  our treatment plan. No barriers to understanding were identified. We discussed Red Flag symptoms and signs in detail. Patient expressed understanding regarding what to do in case of urgent or emergency type symptoms.   Medication list was reconciled, printed and provided to the patient in AVS. Patient instructions and summary information was reviewed with the patient as documented in the AVS. This note was prepared with assistance of Dragon voice recognition software. Occasional wrong-word or sound-a-like substitutions may have occurred due to the inherent limitations of voice recognition software

## 2018-01-27 NOTE — Patient Instructions (Signed)
Please return in 12 months for your annual complete physical; please come fasting.  Please ask Earnest Bailey to add hepatitis C antibody and HIV screening. Send me the results.   It was a pleasure meeting you today! Thank you for choosing [place] to meet your healthcare needs! I truly look forward to working with you. If you have any questions or concerns, please send me a message via Mychart or call the office at [phone]. Please do these things to maintain good health!   Exercise at least 30-45 minutes a day,  4-5 days a week.   Eat a low-fat diet with lots of fruits and vegetables, up to 7-9 servings per day.  Drink plenty of water daily. Try to drink 8 8oz glasses per day.  Seatbelts can save your life. Always wear your seatbelt.  Place Smoke Detectors on every level of your home and check batteries every year.  Schedule an appointment with an eye doctor for an eye exam every 1-2 years  Safe sex - use condoms to protect yourself from STDs if you could be exposed to these types of infections. Use birth control if you do not want to become pregnant and are sexually active.  Avoid heavy alcohol use. If you drink, keep it to less than 2 drinks/day and not every day.  [place].  Choose someone you trust that could speak for you if you became unable to speak for yourself.  Depression is common in our stressful world.If you're feeling down or losing interest in things you normally enjoy, please come in for a visit.  If anyone is threatening or hurting you, please get help. Physical or Emotional Violence is never OK.

## 2018-02-06 ENCOUNTER — Encounter: Payer: Self-pay | Admitting: Family Medicine

## 2018-02-08 ENCOUNTER — Encounter: Payer: Self-pay | Admitting: Family Medicine

## 2018-02-15 ENCOUNTER — Encounter: Payer: Self-pay | Admitting: Family Medicine

## 2018-02-16 ENCOUNTER — Ambulatory Visit (INDEPENDENT_AMBULATORY_CARE_PROVIDER_SITE_OTHER): Payer: Commercial Managed Care - PPO | Admitting: Emergency Medicine

## 2018-02-16 DIAGNOSIS — Z23 Encounter for immunization: Secondary | ICD-10-CM | POA: Diagnosis not present

## 2018-02-16 NOTE — Progress Notes (Signed)
Leslie Deleon is a 57 y.o. female presents to the office today for 1st Shingrix injection, per physician's orders. Shingrix (med), 0.13ml  (dose), IM (route) was administered Left Deltoid (location) today. Patient tolerated injection. Patient next injection due: 2-6 months.  [name]   Reviewed.   Leeanne Rio, PA-C

## 2018-03-27 ENCOUNTER — Encounter: Payer: Self-pay | Admitting: Family Medicine

## 2018-03-28 MED ORDER — GLUCOSE BLOOD VI STRP: ORAL_STRIP | 3 refills | Status: DC

## 2018-03-28 NOTE — Addendum Note (Signed)
Addended by: Billey Chang on: 03/28/2018 10:14 AM   Modules accepted: Orders

## 2018-03-28 NOTE — Telephone Encounter (Signed)
The prescription was put in for me as a proactive measurement since diabetes runs in my family. I've been monitoring my blood sugar level very closely and the tests have helped me lower my A1c from 5.9 to 5.6 over the past four years.  Will you be able to put in an order for me? If not, what needs to be done for me to get the strips?  See message from Patient above, Please advise? Okay to send in?  Doloris Hall,  LPN

## 2018-03-29 ENCOUNTER — Other Ambulatory Visit: Payer: Self-pay | Admitting: Family Medicine

## 2018-06-13 ENCOUNTER — Other Ambulatory Visit: Payer: Self-pay | Admitting: Obstetrics and Gynecology

## 2018-06-13 DIAGNOSIS — Z1231 Encounter for screening mammogram for malignant neoplasm of breast: Secondary | ICD-10-CM

## 2018-06-21 ENCOUNTER — Encounter: Payer: Self-pay | Admitting: Family Medicine

## 2018-06-23 ENCOUNTER — Ambulatory Visit (INDEPENDENT_AMBULATORY_CARE_PROVIDER_SITE_OTHER): Payer: Commercial Managed Care - PPO | Admitting: *Deleted

## 2018-06-23 DIAGNOSIS — Z23 Encounter for immunization: Secondary | ICD-10-CM

## 2018-07-31 ENCOUNTER — Ambulatory Visit
Admission: RE | Admit: 2018-07-31 | Discharge: 2018-07-31 | Disposition: A | Discharge status: Home / Self Care | Payer: Commercial Managed Care - PPO | Source: Ambulatory Visit | Attending: Obstetrics and Gynecology | Admitting: Obstetrics and Gynecology

## 2018-07-31 DIAGNOSIS — Z1231 Encounter for screening mammogram for malignant neoplasm of breast: Secondary | ICD-10-CM | POA: Diagnosis not present

## 2018-09-27 ENCOUNTER — Encounter: Payer: Self-pay | Admitting: Family Medicine

## 2019-04-05 ENCOUNTER — Encounter: Payer: Self-pay | Admitting: Family Medicine

## 2019-09-14 ENCOUNTER — Encounter: Payer: Self-pay | Admitting: Family Medicine

## 2019-12-08 ENCOUNTER — Encounter: Payer: Self-pay | Admitting: Family Medicine

## 2019-12-13 ENCOUNTER — Encounter: Payer: Self-pay | Admitting: Family Medicine

## 2019-12-13 ENCOUNTER — Other Ambulatory Visit: Payer: Self-pay

## 2019-12-13 ENCOUNTER — Telehealth (INDEPENDENT_AMBULATORY_CARE_PROVIDER_SITE_OTHER): Payer: Managed Care, Other (non HMO) | Admitting: Family Medicine

## 2019-12-13 DIAGNOSIS — B009 Herpesviral infection, unspecified: Secondary | ICD-10-CM

## 2019-12-13 MED ORDER — VALACYCLOVIR HCL 500 MG PO TABS: 500.0000 mg | ORAL_TABLET | Freq: Two times a day (BID) | ORAL | 1 refills | Status: DC

## 2019-12-13 NOTE — Progress Notes (Signed)
Virtual Visit via Video Note  Subjective  CC:  Chief Complaint  Patient presents with  . Mouth Lesions    started around Sunday, states that cold sore is healing, applied Abrevia      I connected with Leslie Deleon on 12/13/19 at 11:30 AM EDT by a video enabled telemedicine application and verified that I am speaking with the correct person using two identifiers. Location patient: Home Location provider: [facility] [place], Office Persons participating in the virtual visit: Leslie Deleon, Leamon Arnt, MD Serita Sheller, [address] [place] discussed the limitations of evaluation and management by telemedicine and the availability of in person appointments. The patient expressed understanding and agreed to proceed. HPI: Leslie Deleon is a 59 y.o. female who was contacted today to address the problems listed above in the chief complaint. . Noted blister like sores on inside of right nostril about a month ago. Has h/o cold sores. Resolved spontaneously but now has another on the inside of left nostril. Has paresthesias and burning soreness to lesions. No pus or fevers. No lesions on tip of nose but admits to scratchy eye sensation bilaterally w/o redness or eye pain or photophobia. Has mild seasonal allergy sxs ongoing with mild PND and sneezing. No other rash. Started abreva topically. Admits to increase work stress over the last 6 weeks.   Assessment  1. HSV infection      Plan   HSV, nasal vestibule:  Education given. Supportive care with advil. Start valtrex 500 bid x 7 days and monitor for any other skin lesions or eye sxs. F/u in office if not improving.  I discussed the assessment and treatment plan with the patient. The patient was provided an opportunity to ask questions and all were answered. The patient agreed with the plan and demonstrated an understanding of the instructions.   The patient was advised to call back or seek an in-person evaluation if the symptoms  worsen or if the condition fails to improve as anticipated. Follow up: as scheduled for cpe  03/05/2020  No orders of the defined types were placed in this encounter.     I reviewed the patients updated PMH, FH, and SocHx.    Patient Active Problem List   Diagnosis Date Noted  . History of endometrial hyperplasia s/p hysterectomy and bil salpingectomy 06/27/2017   Current Meds  Medication Sig  . glucose blood test strip Use as instructed  . Multiple Vitamins-Minerals (MULTIVITAMIN PO) Take 1 tablet daily by mouth.  . Omega-3 Fatty Acids (FISH OIL) 1000 MG CAPS Take 2,000 mg daily by mouth.  Leslie Deleon DELICA LANCETS [id number] MISC USE AS DIRECTED    Allergies: Patient has No Known Allergies. Family History: Patient family history includes Bipolar disorder in her brother; Depression in her brother; Diabetes in her maternal grandmother and mother; Healthy in her daughter; Hepatitis B in her father; Hypertension in her father and mother; Kidney disease in her son; Liver cancer in her father; Suicidality in her brother. Social History:  Patient  reports that she has never smoked. She has never used smokeless tobacco. She reports that she does not drink alcohol or use drugs.  Review of Systems: Constitutional: Negative for fever malaise or anorexia Cardiovascular: negative for chest pain Respiratory: negative for SOB or persistent cough Gastrointestinal: negative for abdominal pain  OBJECTIVE Vitals: LMP 05/03/2017  General: no acute distress , A&Ox3 HEENT: left inner nostril with blistering rash. See mychart photos.  Leslie Deleon  Leslie Dixon, MD

## 2020-01-15 ENCOUNTER — Telehealth: Payer: Self-pay | Admitting: Family Medicine

## 2020-01-15 NOTE — Telephone Encounter (Signed)
Error

## 2020-03-05 ENCOUNTER — Encounter: Payer: Commercial Managed Care - PPO | Admitting: Family Medicine

## 2020-05-05 ENCOUNTER — Encounter: Payer: Self-pay | Admitting: Family Medicine

## 2020-05-06 ENCOUNTER — Other Ambulatory Visit: Payer: Self-pay

## 2020-05-06 MED ORDER — GLUCOSE BLOOD VI STRP: ORAL_STRIP | 3 refills | Status: DC

## 2020-05-06 MED ORDER — ONETOUCH DELICA LANCETS 33G MISC: 12 refills | Status: AC

## 2020-05-08 LAB — BASIC METABOLIC PANEL
BUN: 16 (ref 4–21)
CO2: 23 — AB (ref 13–22)
Chloride: 105 (ref 99–108)
Creatinine: 0.8 (ref 0.5–1.1)
Glucose: 96
Potassium: 4.3 (ref 3.4–5.3)
Sodium: 143 (ref 137–147)

## 2020-05-08 LAB — COMPREHENSIVE METABOLIC PANEL
Albumin: 4.8 (ref 3.5–5.0)
Calcium: 9.6 (ref 8.7–10.7)
GFR calc Af Amer: 96
GFR calc non Af Amer: 83
Globulin: 2.5

## 2020-05-08 LAB — VITAMIN D 25 HYDROXY (VIT D DEFICIENCY, FRACTURES): Vit D, 25-Hydroxy: 35.4

## 2020-05-08 LAB — CBC AND DIFFERENTIAL
HCT: 41 (ref 36–46)
Hemoglobin: 13.2 (ref 12.0–16.0)
Neutrophils Absolute: 2
WBC: 4.4

## 2020-05-08 LAB — LIPID PANEL
Cholesterol: 191 (ref 0–200)
HDL: 67 (ref 35–70)
LDL Cholesterol: 19
Triglycerides: 108 (ref 40–160)

## 2020-05-08 LAB — HEPATIC FUNCTION PANEL
ALT: 30 (ref 7–35)
AST: 33 (ref 13–35)

## 2020-05-08 LAB — CBC: RBC: 4.27 (ref 3.87–5.11)

## 2020-05-08 LAB — HEMOGLOBIN A1C: Hemoglobin A1C: 5.7

## 2020-05-08 LAB — TSH: TSH: 3.01 (ref 0.41–5.90)

## 2020-05-09 ENCOUNTER — Other Ambulatory Visit: Payer: Self-pay

## 2020-05-09 MED ORDER — GLUCOSE BLOOD VI STRP: ORAL_STRIP | 3 refills | Status: AC

## 2020-05-27 ENCOUNTER — Encounter: Payer: Commercial Managed Care - PPO | Admitting: Family Medicine

## 2020-05-30 ENCOUNTER — Encounter: Payer: Self-pay | Admitting: Family Medicine

## 2020-06-04 ENCOUNTER — Ambulatory Visit (INDEPENDENT_AMBULATORY_CARE_PROVIDER_SITE_OTHER): Payer: Managed Care, Other (non HMO) | Admitting: Family Medicine

## 2020-06-04 ENCOUNTER — Other Ambulatory Visit: Payer: Self-pay

## 2020-06-04 ENCOUNTER — Encounter: Payer: Self-pay | Admitting: Family Medicine

## 2020-06-04 VITALS — BP 102/68 | HR 75 | Temp 98.2°F | Resp 15 | Ht 63.0 in | Wt 125.6 lb

## 2020-06-04 DIAGNOSIS — N951 Menopausal and female climacteric states: Secondary | ICD-10-CM | POA: Diagnosis not present

## 2020-06-04 DIAGNOSIS — Z1231 Encounter for screening mammogram for malignant neoplasm of breast: Secondary | ICD-10-CM

## 2020-06-04 DIAGNOSIS — Z8742 Personal history of other diseases of the female genital tract: Secondary | ICD-10-CM | POA: Diagnosis not present

## 2020-06-04 DIAGNOSIS — Z0000 Encounter for general adult medical examination without abnormal findings: Secondary | ICD-10-CM | POA: Diagnosis not present

## 2020-06-04 NOTE — Progress Notes (Signed)
Subjective  Chief Complaint  Patient presents with  . Annual Exam    had bloodwork done at work last month, wanting to check if she has COVID antibodies - she is [place] see [place] within the next few weeks  . Hot Flashes    was experiencing constantly, recently started OTC supplements that have helped     HPI: Leslie Deleon is a 59 y.o. female who presents to [facility] [place] today for a Female Wellness Visit. She also has the concerns and/or needs as listed above in the chief complaint. These will be addressed in addition to the Health Maintenance Visit.   Wellness Visit: annual visit with health maintenance review and exam without Pap   Health maintenance: Due for flu shot today.  Mammogram due this month.  Patient has not yet scheduled.  She will be traveling [place] to help her parents.  She has been vaccinated for Covid with Moderna vaccine.  She is an average risk patient.  I reviewed lab work from her workplace physical.  Normal CMP, CBC, TSH, vitamin D.  A1c was 5.7.  Lipids are mildly elevated  Chronic disease f/u and/or acute problem visit: (deemed necessary to be done in addition to the wellness visit):  Postmenopausal hot flashes: Slightly worsening.  Using over-the-counter let cohosh with good results.  Minimal interruption of sleep.  Coping well.  She is not on HRT candidate due to history of endometrial hyperplasia with atypia.  Assessment  1. Annual physical exam   2. Encounter for screening mammogram for breast cancer   3. Menopausal hot flushes   4. History of endometrial hyperplasia s/p hysterectomy and bil salpingectomy      Plan  Female Wellness Visit:  Age appropriate Health Maintenance and Prevention measures were discussed with patient. Included topics are cancer screening recommendations, ways to keep healthy (see AVS) including dietary and exercise recommendations, regular eye and dental care, use of seat belts, and  avoidance of moderate alcohol use and tobacco use.  Patient will try to schedule mammogram before she leaves for [place].  BMI: discussed patient's BMI and encouraged positive lifestyle modifications to help get to or maintain a target BMI.  HM needs and immunizations were addressed and ordered. See below for orders. See HM and immunization section for updates.  Flu shot today  Routine labs and screening tests ordered including cmp, cbc and lipids where appropriate.  Discussed recommendations regarding Vit D and calcium supplementation (see AVS)  Chronic disease management visit and/or acute problem visit:  Hot flashes: Educated on treatment options.  She is coping well.  Symptoms are mild.  Continue black cohosh.  Follow-up if worsening.   Follow up: Return in about 1 year (around 06/04/2021) for complete physical.  Orders Placed This Encounter  Procedures  . MM DIGITAL SCREENING BILATERAL  . CBC and differential  . CBC  . VITAMIN D 25 Hydroxy (Vit-D Deficiency, Fractures)  . Basic metabolic panel  . Comprehensive metabolic panel  . Lipid panel  . Hepatic function panel  . Hemoglobin A1c  . TSH   No orders of the defined types were placed in this encounter.     Lifestyle: Body mass index is 22.25 kg/m. Wt Readings from Last 3 Encounters:  06/04/20 125 lb 9.6 oz (57 kg)  01/27/18 127 lb 3.2 oz (57.7 kg)  08/17/17 128 lb 6.4 oz (58.2 kg)   Diet: low fat Exercise: frequently,   Patient Active Problem List  Diagnosis Date Noted  . History of endometrial hyperplasia s/p hysterectomy and bil salpingectomy 06/27/2017   Health Maintenance  Topic Date Due  . Hepatitis C Screening  Never done  . HIV Screening  Never done  . MAMMOGRAM  06/04/2020  . INFLUENZA VACCINE  11/20/2020 (Originally 03/23/2020)  . COLONOSCOPY  11/28/2024  . TETANUS/TDAP  01/28/2028  . COVID-19 Vaccine  Completed  . PAP SMEAR-Modifier  Discontinued   Immunization History  Administered Date(s)  Administered  . Moderna SARS-COVID-2 Vaccination 11/12/2019, 12/14/2019  . Tdap 01/27/2018  . Zoster Recombinat (Shingrix) 02/16/2018, 06/23/2018   We updated and reviewed the patient's past history in detail and it is documented below. Allergies: Patient has No Known Allergies. Past Medical History Patient  has a past medical history of Endometrial cancer ([place]), Endometrial hyperplasia with atypia, and Kidney infection (age [age]). Past Surgical History Patient  has a past surgical history that includes Cesarean section; Dilatation & curettage/hysteroscopy with myosure; Hysteroscopy (11/26/2015); Colonoscopy (2015/2016); Robotic assisted total hysterectomy with bilateral salpingo oophorectomy (N/A, 07/19/2017); and Sentinel node biopsy (N/A, 07/19/2017). Family History: Patient family history includes Bipolar disorder in her brother; Depression in her brother; Diabetes in her maternal grandmother and mother; Healthy in her daughter; Hepatitis B in her father; Hypertension in her father and mother; Kidney disease in her son; Liver cancer in her father; Suicidality in her brother. Social History:  Patient  reports that she has never smoked. She has never used smokeless tobacco. She reports that she does not drink alcohol and does not use drugs.  Review of Systems: Constitutional: negative for fever or malaise Ophthalmic: negative for photophobia, double vision or loss of vision Cardiovascular: negative for chest pain, dyspnea on exertion, or new LE swelling Respiratory: negative for SOB or persistent cough Gastrointestinal: negative for abdominal pain, change in bowel habits or melena Genitourinary: negative for dysuria or gross hematuria, no abnormal uterine bleeding or disharge Musculoskeletal: negative for new gait disturbance or muscular weakness Integumentary: negative for new or persistent rashes, no breast lumps Neurological: negative for TIA or stroke symptoms Psychiatric: negative  for SI or delusions Allergic/Immunologic: negative for hives  Patient Care Team    Relationship Specialty Notifications Start End  Leamon Arnt, MD PCP - General Family Medicine  02/15/18   Paula Compton, MD Consulting Physician Obstetrics and Gynecology  01/27/18   Sherlynn Stalls, MD Consulting Physician Ophthalmology  06/04/20     Objective  Vitals: BP 102/68   Pulse 75   Temp 98.2 F (36.8 C) (Temporal)   Resp 15   Ht 5\' 3"  (1.6 m)   Wt 125 lb 9.6 oz (57 kg)   LMP 05/03/2017   SpO2 98%   BMI 22.25 kg/m  General:  Well developed, well nourished, no acute distress  Psych:  Alert and orientedx3,normal mood and affect HEENT:  Normocephalic, atraumatic, non-icteric sclera,  supple neck without adenopathy, mass or thyromegaly Cardiovascular:  Normal S1, S2, RRR without gallop, rub or murmur Respiratory:  Good breath sounds bilaterally, CTAB with normal respiratory effort Gastrointestinal: normal bowel sounds, soft, non-tender, no noted masses. No HSM MSK: no deformities, contusions. Joints are without erythema or swelling.  Skin:  Warm, no rashes or suspicious lesions noted Neurologic:    Mental status is normal. CN 2-11 are normal. Gross motor and sensory exams are normal. Normal gait. No tremor Breast Exam: No mass, skin retraction or nipple discharge is appreciated in either breast. No axillary adenopathy. Fibrocystic changes are not noted   Commons  side effects, risks, benefits, and alternatives for medications and treatment plan prescribed today were discussed, and the patient expressed understanding of the given instructions. Patient is instructed to call or message via MyChart if he/she has any questions or concerns regarding our treatment plan. No barriers to understanding were identified. We discussed Red Flag symptoms and signs in detail. Patient expressed understanding regarding what to do in case of urgent or emergency type symptoms.   Medication list was reconciled,  printed and provided to the patient in AVS. Patient instructions and summary information was reviewed with the patient as documented in the AVS. This note was prepared with assistance of Dragon voice recognition software. Occasional wrong-word or sound-a-like substitutions may have occurred due to the inherent limitations of voice recognition software  This visit occurred during the SARS-CoV-2 public health emergency.  Safety protocols were in place, including screening questions prior to the visit, additional usage of staff PPE, and extensive cleaning of exam room while observing appropriate contact time as indicated for disinfecting solutions.

## 2020-06-04 NOTE — Patient Instructions (Signed)
Please return in 12 months for your annual complete physical; please come fasting.  Please ask Earnest Bailey to add a Hep C virus antibody screen to your labs next year.  Travel safely!  If you have any questions or concerns, please don't hesitate to send me a message via MyChart or call the office at [phone]. Thank you for visiting with [place] today! It's our pleasure caring for you.  I have ordered a mammogram and/or bone density for you as we discussed today: [x]   Mammogram  []   Bone Density  Please call the office checked below to schedule your appointment: Your appointment will at the following location  [x]   [facility]      [place], [place]         []   [facility]  [place], [place]  Make sure to wear two peace clothing  No lotions powders or deodorants the day of the appointment Make sure to bring picture ID and insurance card.  Bring list of medications you are currently taking including any supplements.

## 2020-06-06 ENCOUNTER — Other Ambulatory Visit: Payer: Self-pay | Admitting: Family Medicine

## 2020-06-06 DIAGNOSIS — Z1231 Encounter for screening mammogram for malignant neoplasm of breast: Secondary | ICD-10-CM

## 2020-06-11 ENCOUNTER — Ambulatory Visit
Admission: RE | Admit: 2020-06-11 | Discharge: 2020-06-11 | Disposition: A | Discharge status: Home / Self Care | Payer: Managed Care, Other (non HMO) | Source: Ambulatory Visit

## 2020-06-11 ENCOUNTER — Other Ambulatory Visit: Payer: Self-pay

## 2020-06-11 DIAGNOSIS — Z1231 Encounter for screening mammogram for malignant neoplasm of breast: Secondary | ICD-10-CM

## 2020-06-18 ENCOUNTER — Other Ambulatory Visit: Payer: Managed Care, Other (non HMO)

## 2021-02-11 ENCOUNTER — Other Ambulatory Visit: Payer: Self-pay

## 2021-02-11 ENCOUNTER — Encounter: Payer: Self-pay | Admitting: Family Medicine

## 2021-02-11 DIAGNOSIS — M76899 Other specified enthesopathies of unspecified lower limb, excluding foot: Secondary | ICD-10-CM

## 2021-02-11 NOTE — Progress Notes (Signed)
    Subjective:    I'm seeing this patient as a consultation for: Dr. Billey Chang. Note will be routed back to referring provider/PCP.  CC: Right hip snapping  I, Wendy Poet, LAT, ATC, am serving as scribe for Dr. Lynne Leader.  HPI: Pt is a 60 y/o female c/o R hip snapping/popping x one month. Pt locates popping to to her R post glute and buttock.  She denies any pain in her R hip or glute.  R hip mechanical symptoms: yes LE Numbness/tingling: No Aggravates: walking;  Treatments tried: stretching; taking smaller strides   Past medical history, Surgical history, Family history, Social history, Allergies, and medications have been entered into the medical record, reviewed.   Review of Systems: No new headache, visual changes, nausea, vomiting, diarrhea, constipation, dizziness, abdominal pain, skin rash, fevers, chills, night sweats, weight loss, swollen lymph nodes, body aches, joint swelling, muscle aches, chest pain, shortness of breath, mood changes, visual or auditory hallucinations.   Objective:    Vitals:   02/12/21 1320  BP: 102/62  Pulse: 63  SpO2: 98%   General: Well Developed, well nourished, and in no acute distress.  Neuro/Psych: Alert and oriented x3, extra-ocular muscles intact, able to move all 4 extremities, sensation grossly intact. Skin: Warm and dry, no rashes noted.  Respiratory: Not using accessory muscles, speaking in full sentences, trachea midline.  Cardiovascular: Pulses palpable, no extremity edema. Abdomen: Does not appear distended. MSK: Right hip normal-appearing Normal motion. Mildly tender palpation greater trochanter. Hip abduction and external rotation strength mildly diminished 4/5 with some pain. Normal gait. Leg lengths equal.    Impression and Recommendations:    Assessment and Plan: 60 y.o. female with right lateral hip snapping with minimal pain.  Secondary to external snapping hip syndrome.  Fundamentally this is an issue of hip  abductor and rotator weakness.  Plan to treat with home exercise program focusing on hip abductor and external rotator strengthening and stretching.  She already is participating in [place] exercise class and yoga both of which should be quite helpful for this.  We will do some home exercise teaching with ATC in clinic today.  If not improving in a few weeks and she will let me know and I will refer to physical therapy.  Recheck as needed.   Discussed warning signs or symptoms. Please see discharge instructions. Patient expresses understanding.   The above documentation has been reviewed and is accurate and complete Lynne Leader, M.D.

## 2021-02-12 ENCOUNTER — Ambulatory Visit: Payer: Managed Care, Other (non HMO) | Admitting: Family Medicine

## 2021-02-12 ENCOUNTER — Encounter: Payer: Self-pay | Admitting: Family Medicine

## 2021-02-12 ENCOUNTER — Ambulatory Visit: Payer: Self-pay

## 2021-02-12 ENCOUNTER — Other Ambulatory Visit: Payer: Self-pay

## 2021-02-12 VITALS — BP 102/62 | HR 63 | Ht 63.0 in | Wt 125.6 lb

## 2021-02-12 DIAGNOSIS — M25551 Pain in right hip: Secondary | ICD-10-CM | POA: Diagnosis not present

## 2021-02-12 DIAGNOSIS — M24851 Other specific joint derangements of right hip, not elsewhere classified: Secondary | ICD-10-CM

## 2021-02-12 NOTE — Patient Instructions (Addendum)
Thank you for coming in today.   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   Work on strength for hip abductors and external rotators  (Gluteus medius and minimus and piriformis)   Do the exercises we reviewed today.   Please perform the exercise program that we have prepared for you and gone over in detail on a daily basis.  In addition to the handout you were provided you can access your program through: [email or web address]   Your unique program code is:   [email or web address]  Ask the [facility] class instructor to help target these muscle groups for you in strength and stretching.   If you are not improving let me know and I will refer to PT.   I am can see back here at anytime.

## 2021-04-29 ENCOUNTER — Other Ambulatory Visit: Payer: Self-pay | Admitting: Family Medicine

## 2021-04-29 DIAGNOSIS — Z1231 Encounter for screening mammogram for malignant neoplasm of breast: Secondary | ICD-10-CM

## 2021-06-15 ENCOUNTER — Ambulatory Visit
Admission: RE | Admit: 2021-06-15 | Discharge: 2021-06-15 | Disposition: A | Discharge status: Home / Self Care | Payer: Managed Care, Other (non HMO) | Source: Ambulatory Visit

## 2021-06-15 ENCOUNTER — Other Ambulatory Visit: Payer: Self-pay

## 2021-06-15 DIAGNOSIS — Z1231 Encounter for screening mammogram for malignant neoplasm of breast: Secondary | ICD-10-CM

## 2021-07-23 LAB — BASIC METABOLIC PANEL
BUN: 16 (ref 4–21)
CO2: 24 — AB (ref 13–22)
Chloride: 105 (ref 99–108)
Creatinine: 0.8 (ref 0.5–1.1)
Glucose: 100
Potassium: 4 (ref 3.4–5.3)
Sodium: 141 (ref 137–147)

## 2021-07-23 LAB — LIPID PANEL
Cholesterol: 176 (ref 0–200)
HDL: 84 — AB (ref 35–70)
LDL Cholesterol: 77
LDl/HDL Ratio: 15
Triglycerides: 84 (ref 40–160)

## 2021-07-23 LAB — COMPREHENSIVE METABOLIC PANEL
Albumin: 4.9 (ref 3.5–5.0)
Calcium: 9.7 (ref 8.7–10.7)
Globulin: 2.5

## 2021-07-23 LAB — CBC: RBC: 4.57 (ref 3.87–5.11)

## 2021-07-23 LAB — HEMOGLOBIN A1C: Hemoglobin A1C: 5.8

## 2021-07-23 LAB — HEPATIC FUNCTION PANEL
ALT: 21 (ref 7–35)
AST: 25 (ref 13–35)
Bilirubin, Total: 0.5

## 2021-07-23 LAB — CBC AND DIFFERENTIAL
HCT: 42 (ref 36–46)
Hemoglobin: 14 (ref 12.0–16.0)
Neutrophils Absolute: 51
Platelets: 209 (ref 150–399)
WBC: 3.9

## 2021-07-23 LAB — TSH: TSH: 2.63 (ref 0.41–5.90)

## 2021-07-29 ENCOUNTER — Encounter: Payer: Self-pay | Admitting: Family Medicine

## 2021-07-29 ENCOUNTER — Other Ambulatory Visit: Payer: Self-pay

## 2021-07-29 ENCOUNTER — Ambulatory Visit (INDEPENDENT_AMBULATORY_CARE_PROVIDER_SITE_OTHER): Payer: Managed Care, Other (non HMO) | Admitting: Family Medicine

## 2021-07-29 VITALS — BP 128/80 | HR 63 | Temp 98.4°F | Ht 63.0 in | Wt 127.2 lb

## 2021-07-29 DIAGNOSIS — K635 Polyp of colon: Secondary | ICD-10-CM

## 2021-07-29 DIAGNOSIS — R7303 Prediabetes: Secondary | ICD-10-CM

## 2021-07-29 DIAGNOSIS — K625 Hemorrhage of anus and rectum: Secondary | ICD-10-CM

## 2021-07-29 DIAGNOSIS — Z0001 Encounter for general adult medical examination with abnormal findings: Secondary | ICD-10-CM | POA: Diagnosis not present

## 2021-07-29 DIAGNOSIS — Z0000 Encounter for general adult medical examination without abnormal findings: Secondary | ICD-10-CM

## 2021-07-29 DIAGNOSIS — K5790 Diverticulosis of intestine, part unspecified, without perforation or abscess without bleeding: Secondary | ICD-10-CM

## 2021-07-29 LAB — HEMOCCULT GUIAC POC 1CARD (OFFICE): Fecal Occult Blood, POC: NEGATIVE

## 2021-07-29 NOTE — Patient Instructions (Signed)
Please return in 12 months for your annual complete physical; please come fasting.  Please ask Earnest Bailey to screen for Hepatitis C with a hepatitis C antibody test at your next lab work visit.  Start strength training. This will help with your prediabetes.  I will likely refer you back to Dr. Collene Mares to further assess your rectal bleeding.   If you have any questions or concerns, please don't hesitate to send me a message via MyChart or call the office at [phone]. Thank you for visiting with [place] today! It's our pleasure caring for you.   Please do these things to maintain good health!  Exercise at least 30-45 minutes a day,  4-5 days a week.  Eat a low-fat diet with lots of fruits and vegetables, up to 7-9 servings per day. Drink plenty of water daily. Try to drink 8 8oz glasses per day. Seatbelts can save your life. Always wear your seatbelt. Place Smoke Detectors on every level of your home and check batteries every year. Schedule an appointment with an eye doctor for an eye exam every 1-2 years Safe sex - use condoms to protect yourself from STDs if you could be exposed to these types of infections. Use birth control if you do not want to become pregnant and are sexually active. Avoid heavy alcohol use. If you drink, keep it to less than 2 drinks/day and not every day. [place].  Choose someone you trust that could speak for you if you became unable to speak for yourself. Depression is common in our stressful world.If you're feeling down or losing interest in things you normally enjoy, please come in for a visit. If anyone is threatening or hurting you, please get help. Physical or Emotional Violence is never OK.   Rectal Bleeding Rectal bleeding is when blood passes out of the opening between the buttocks (anus). People with rectal bleeding may notice bright red blood in their underwear or in the toilet after having a bowel movement. They may also have blood mixed with  their stool (feces), or dark red or black stools. Rectal bleeding is usually a sign that something is wrong. Many things can cause rectal bleeding, including: Diverticulosis. This is a condition in which pockets or sacs project from the bowel. Hemorrhoids. These are blood vessels around the anus or inside the rectum that are larger than normal. Anal fissures. This is a tear in the anus. Proctitis and colitis. These are conditions in which the rectum, colon, or anus become inflamed. Polyps. These are growths that can be cancerous (malignant) or noncancerous (benign). Infections of the intestines. Fistulas. These are abnormal openings in the rectum and anus. Rectal prolapse. This is when a part of the rectum sticks out from the anus. Follow these instructions at home: Pay attention to any changes in your symptoms. Take these actions to help reduce bleeding and discomfort: Medicines Take over-the-counter and prescription medicines only as told by your health care provider. Ask your health care provider about changing or stopping your regular medicines or supplements. This is especially important if you are taking blood thinners. Medicines that thin the blood can make rectal bleeding worse. Managing constipation Your condition may cause constipation. To prevent or treat constipation, or to help make your stools soft, you may need to: Drink enough fluid to keep your urine pale yellow. Take over-the-counter or prescription medicines. Eat foods that are high in fiber, such as beans, whole grains, and fresh fruits and vegetables. Ask your  health care provider if you need a supplement to give you more fiber. Limit foods that are high in fat and processed sugars, such as fried or sweet foods.  General instructions Try not to strain when having a bowel movement. Try taking a warm bath. This may help to soothe any pain in your rectum. Keep all follow-up visits as told by your health care provider. This  is important. Contact a health care provider if you: Have pain or tenderness in your abdomen. Have a fever. Have weakness. Have nausea. Cannot have a bowel movement. Get help right away if you have: New or increased rectal bleeding. Black or dark red stools. Vomit with blood or something that looks like coffee grounds. A fainting episode. Severe pain in your rectum. Summary Rectal bleeding is usually a sign that something is wrong. This condition should be evaluated by a health care provider. Eat a diet that is high in fiber. This will help keep your stools soft, making it easier to pass stools without straining. Medicines that thin the blood can make rectal bleeding worse. Get help right away if you have new or increased rectal bleeding, black or dark red stools, blood in your vomit, an episode of fainting, or severe pain in your rectum. This information is not intended to replace advice given to you by your health care provider. Make sure you discuss any questions you have with your health care provider. Document Revised: 07/11/2019 Document Reviewed: 07/11/2019 Elsevier Patient Education  2022 [employer].

## 2021-07-29 NOTE — Progress Notes (Signed)
Subjective  Chief Complaint  Patient presents with   Annual Exam    Not fasting    HPI: Leslie Deleon is a 60 y.o. female who presents to [facility] [place] today for a Female Wellness Visit. She also has the concerns and/or needs as listed above in the chief complaint. These will be addressed in addition to the Health Maintenance Visit.   Wellness Visit: annual visit with health maintenance review and exam without Pap  HM: screens are current. Imms : flu shot today.  She brings in lab work from [employer].  Everything looks great except hemoglobin A1c is up to 5.8 Chronic disease f/u and/or acute problem visit: (deemed necessary to be done in addition to the wellness visit): C/o rectal bleeding: Patient reports labile blood per rectum sometimes noted in bold but often noted with wiping on the TP, small amount, painless but started about a year ago and occurs about once weekly.  Occasionally she will have firm hard stool with painful defecation but this is rare.  Eats a very healthy diet.  Get plenty of fiber.  Mostly normal brown soft stools are passed daily.  No abdominal pain, mucus in the stool, diarrhea, melena or weight loss noted.  Last colonoscopy was reviewed, several [place] diverticula were noted and one diminutive polyp was removed.  I do not have the pathology on the biopsy.  Patient is unsure whether she was told to come back in 5 or 10 years, however she never received a letter from GI requesting a follow-up.  She has no family history of colon cancer. Prediabetes: Eats a very healthy diet.  Strong family history of diabetes.   Assessment  1. Annual physical exam   2. Diverticulosis   3. Polyp of colon, unspecified part of colon, unspecified type   4. Rectal bleeding   5. Prediabetes      Plan  Female Wellness Visit: Age appropriate Health Maintenance and Prevention measures were discussed with patient. Included topics are cancer screening  recommendations, ways to keep healthy (see AVS) including dietary and exercise recommendations, regular eye and dental care, use of seat belts, and avoidance of moderate alcohol use and tobacco use.  Screens are current BMI: discussed patient's BMI and encouraged positive lifestyle modifications to help get to or maintain a target BMI. HM needs and immunizations were addressed and ordered. See below for orders. See HM and immunization section for updates.  Flu shot today Routine labs and screening tests ordered including cmp, cbc and lipids where appropriate. Discussed recommendations regarding Vit D and calcium supplementation (see AVS)  Chronic disease management visit and/or acute problem visit: Rectal bleeding: Multiple benign characteristics including duration without progression, painless and normal exam.  However, need to follow-up on her most recent colonoscopy to ensure she is not overdue for a repeat.  Given length of symptoms without clear source, i.e. no visible hemorrhoids or fissures, recommend GI evaluation.  Normal lab work and no anemia Colon polyp: Request medical records from Dr. Collene Mares Prediabetes: Eats a healthy diet.  Recommend increase strength training to help with metabolic use of glucose and insulin sensitivity.  Follow annually at this point.  Follow up: Return in about 1 year (around 07/29/2022) for complete physical.  Orders Placed This Encounter  Procedures   POCT Occult Blood Stool    No orders of the defined types were placed in this encounter.     Body mass index is 22.53 kg/m. 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    Colon polyp 07/29/2021    Colonoscopy 2016    Prediabetes 07/29/2021   History of endometrial hyperplasia s/p hysterectomy  and bil salpingectomy 06/27/2017   Health Maintenance  Topic Date Due   Hepatitis C Screening  Never done   COVID-19 Vaccine (3 - Booster for Moderna series) 02/08/2020   INFLUENZA VACCINE  Never done   MAMMOGRAM  06/15/2022   COLONOSCOPY (Pts 45-67yrs Insurance coverage will need to be confirmed)  11/28/2024   TETANUS/TDAP  01/28/2028   Zoster Vaccines- Shingrix  Completed   Pneumococcal Vaccine 68-70 Years old  Aged Out   HPV VACCINES  Aged Out   PAP SMEAR-Modifier  Discontinued   HIV Screening  Discontinued   Immunization History  Administered Date(s) Administered   [employer] Vaccination 11/12/2019, 12/14/2019   Tdap 01/27/2018   Zoster Recombinat (Shingrix) 02/16/2018, 06/23/2018   We updated and reviewed the patient's past history in detail and it is documented below. Allergies: Patient has No Known Allergies. Past Medical History Patient  has a past medical history of Endometrial cancer ([place]), Endometrial hyperplasia with atypia, and Kidney infection (age 5-25). Past Surgical History Patient  has a past surgical history that includes Cesarean section; Dilatation & curettage/hysteroscopy with myosure; Hysteroscopy (11/26/2015); Colonoscopy (2015/2016); Robotic assisted total hysterectomy with bilateral salpingo oophorectomy (N/A, 07/19/2017); and Sentinel node biopsy (N/A, 07/19/2017). Family History: Patient family history includes Bipolar disorder in her brother; Depression in her brother; Diabetes in her maternal grandmother and mother; Healthy in her daughter; Hepatitis B in her father; Hypertension in her father and mother; Kidney disease in her son; Liver cancer in her father; Suicidality in her brother. Social History:  Patient  reports that she has never smoked. She has never used smokeless tobacco. She reports that she does not drink alcohol and does not use drugs.  Review of Systems: Constitutional: negative for fever or malaise Ophthalmic: negative for  photophobia, double vision or loss of vision Cardiovascular: negative for chest pain, dyspnea on exertion, or new LE swelling Respiratory: negative for SOB or persistent cough Gastrointestinal: negative for abdominal pain, change in bowel habits or melena Genitourinary: negative for dysuria or gross hematuria, no abnormal uterine bleeding or disharge Musculoskeletal: negative for new gait disturbance or muscular weakness Integumentary: negative for new or persistent rashes, no breast lumps Neurological: negative for TIA or stroke symptoms Psychiatric: negative for SI or delusions Allergic/Immunologic: negative for hives  Patient Care Team    Relationship Specialty Notifications Start End  Leamon Arnt, MD PCP - General Family Medicine  02/15/18   Paula Compton, MD Consulting Physician Obstetrics and Gynecology  01/27/18   Sherlynn Stalls, MD Consulting Physician Ophthalmology  06/04/20   Everitt Amber, MD  Gynecologic Oncology  07/29/21     Objective  Vitals: BP 128/80   Pulse 63   Temp 98.4 F (36.9 C) (Temporal)   Ht 5\' 3"  (1.6 m)   Wt 127 lb 3.2 oz (57.7 kg)   LMP 05/03/2017   SpO2 99%   BMI 22.53 kg/m  General:  Well developed, well nourished, no acute distress  Psych:  Alert and orientedx3,normal mood and affect HEENT:  Normocephalic, atraumatic, non-icteric sclera,  supple neck  without adenopathy, mass or thyromegaly Cardiovascular:  Normal S1, S2, RRR without gallop, rub or murmur Respiratory:  Good breath sounds bilaterally, CTAB with normal respiratory effort Gastrointestinal: normal bowel sounds, soft, non-tender, no noted masses. No HSM MSK: no deformities, contusions. Joints are without erythema or swelling.  Skin:  Warm, no rashes or suspicious lesions noted Neurologic:    Mental status is normal. CN 2-11 are normal. Gross motor and sensory exams are normal. Normal gait. No tremor Rectal: Normal rectal tone, no external hemorrhoids present, normal sphincter tone,  no masses, nontender Anoscopy: Using an anoscope lubrication, the rectum was visualized completely, soft brown stool in vault, no hemorrhoids or fissures were present.  Tolerated procedure well.  Stool guaiac testing was negative.  Abstract on 07/29/2021  Component Date Value Ref Range Status   Hemoglobin 07/23/2021 14.0  12.0 - 16.0 Final   HCT 07/23/2021 42  36 - 46 Final   Neutrophils Absolute 07/23/2021 51.00   Final   Platelets 07/23/2021 209  150 - 399 Final   WBC 07/23/2021 3.9   Final   RBC 07/23/2021 4.57  3.87 - 5.11 Final   Glucose 07/23/2021 100   Final   BUN 07/23/2021 16  4 - 21 Final   CO2 07/23/2021 24 (A)  13 - 22 Final   Creatinine 07/23/2021 0.8  0.5 - 1.1 Final   Potassium 07/23/2021 4.0  3.4 - 5.3 Final   Sodium 07/23/2021 141  137 - 147 Final   Chloride 07/23/2021 105  99 - 108 Final   Globulin 07/23/2021 2.5   Final   Calcium 07/23/2021 9.7  8.7 - 10.7 Final   Albumin 07/23/2021 4.9  3.5 - 5.0 Final   LDl/HDL Ratio 07/23/2021 15.0   Final   Triglycerides 07/23/2021 84  40 - 160 Final   Cholesterol 07/23/2021 176  0 - 200 Final   HDL 07/23/2021 84 (A)  35 - 70 Final   LDL Cholesterol 07/23/2021 77   Final   ALT 07/23/2021 21  7 - 35 Final   AST 07/23/2021 25  13 - 35 Final   Bilirubin, Total 07/23/2021 0.5   Final   Hemoglobin A1C 07/23/2021 5.8   Final   TSH 07/23/2021 2.63  0.41 - 5.90 Final  Office Visit on 07/29/2021  Component Date Value Ref Range Status   Fecal Occult Blood, POC 07/29/2021 Negative  Negative Final    Commons side effects, risks, benefits, and alternatives for medications and treatment plan prescribed today were discussed, and the patient expressed understanding of the given instructions. Patient is instructed to call or message via MyChart if he/she has any questions or concerns regarding our treatment plan. No barriers to understanding were identified. We discussed Red Flag symptoms and signs in detail. Patient expressed understanding  regarding what to do in case of urgent or emergency type symptoms.  Medication list was reconciled, printed and provided to the patient in AVS. Patient instructions and summary information was reviewed with the patient as documented in the AVS. This note was prepared with assistance of Dragon voice recognition software. Occasional wrong-word or sound-a-like substitutions may have occurred due to the inherent limitations of voice recognition software  This visit occurred during the SARS-CoV-2 public health emergency.  Safety protocols were in place, including screening questions prior to the visit, additional usage of staff PPE, and extensive cleaning of exam room while observing appropriate contact time as indicated for disinfecting solutions.

## 2021-08-25 ENCOUNTER — Telehealth: Payer: Self-pay

## 2021-08-25 NOTE — Telephone Encounter (Signed)
Patient called in and would like a call back once her results from Dr. Youlanda Mighty office have been received. Patient also would like to know if the coding for last time she was her could be changed since he was charged a office visit with her CPE. Let patient know that since she talked about the anal bleeding with her CPE that usually they can be charged a office visit which she did sign the form letting her know.

## 2021-09-12 IMAGING — MG DIGITAL SCREENING BILAT W/ TOMO W/ CAD
8 series · 9 of 24 positions shown · non-contrast
Comparison: Previous exam(s).

CLINICAL DATA: Screening.

EXAM:
DIGITAL SCREENING BILATERAL MAMMOGRAM WITH TOMO AND CAD

[R MLO synth-2D]
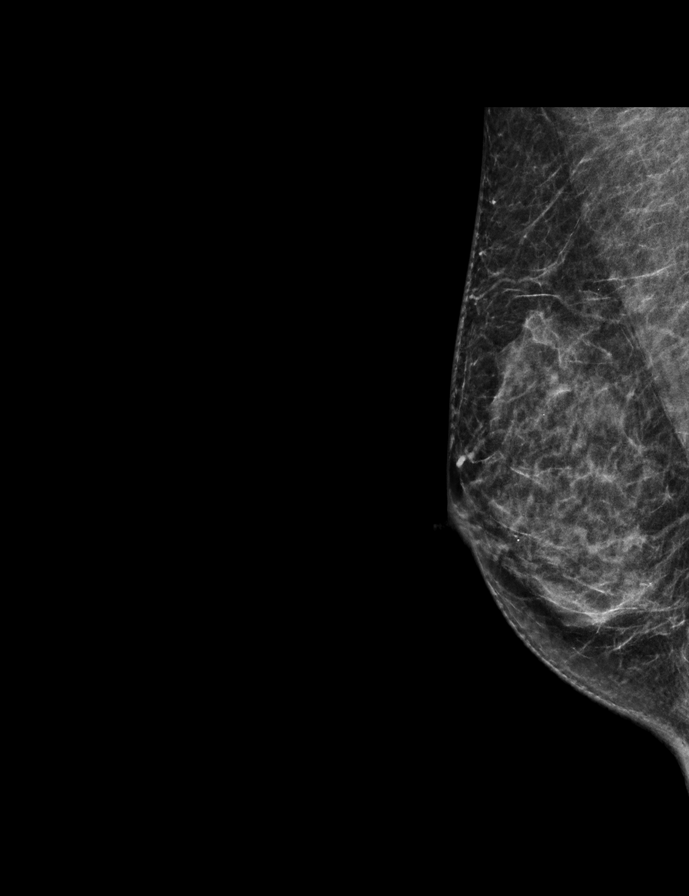

[L CC synth-2D]
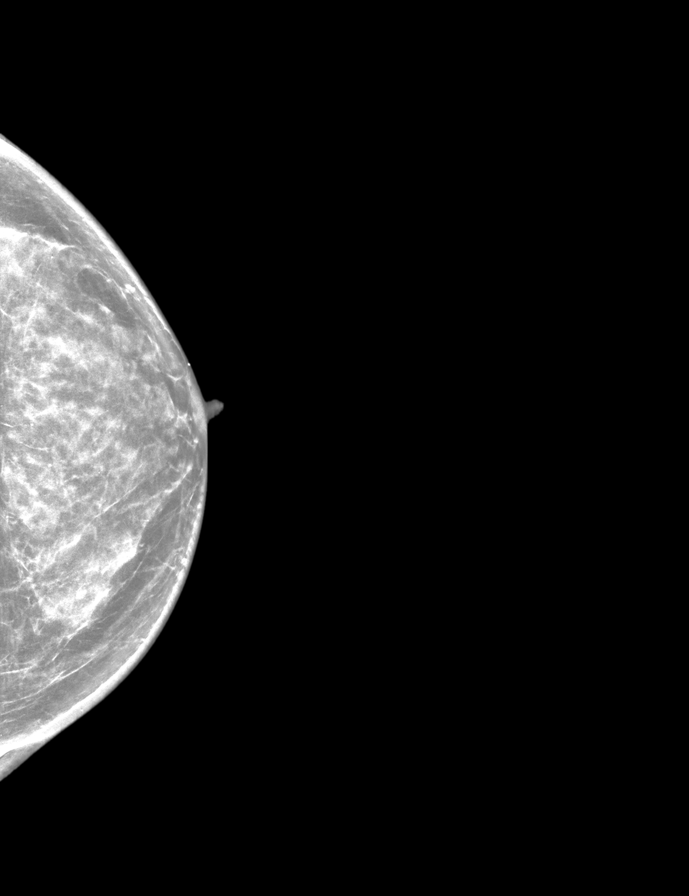

[L MLO synth-2D]
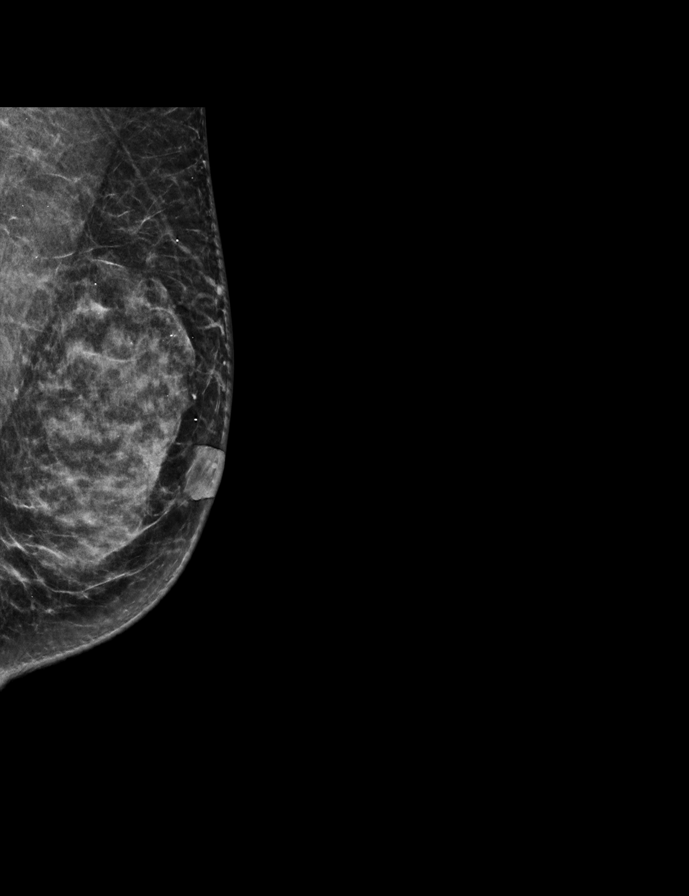

[R CC synth-2D]
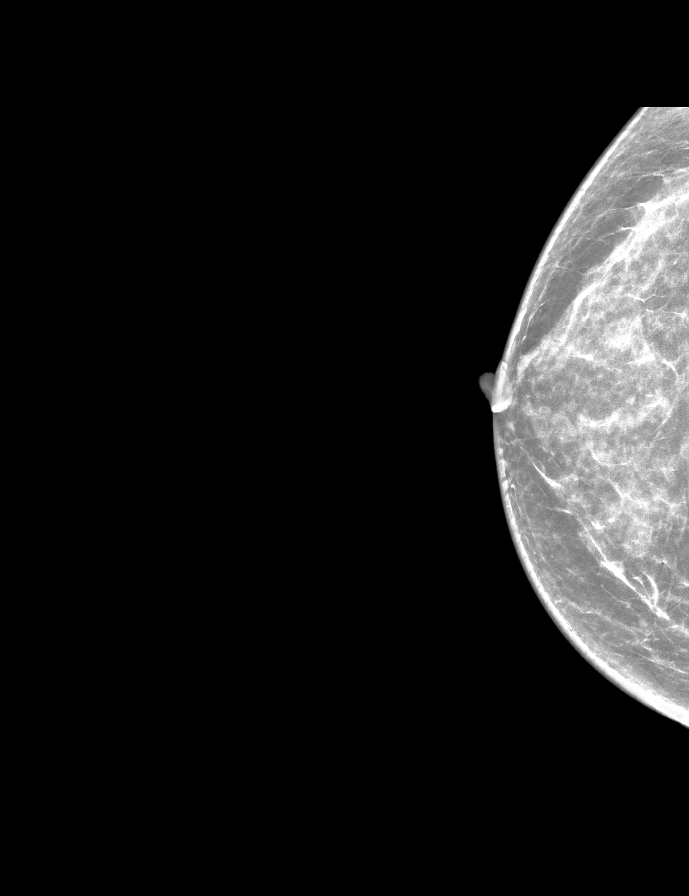

[R MLO tomo · 2 of 52 frames shown]
[frame 17/52]
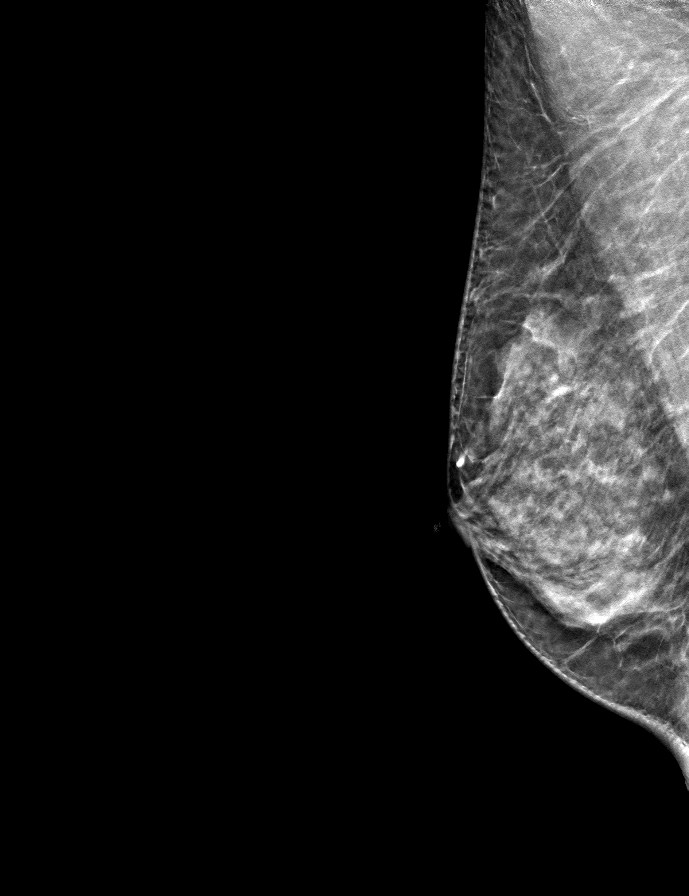
[frame 27/52]
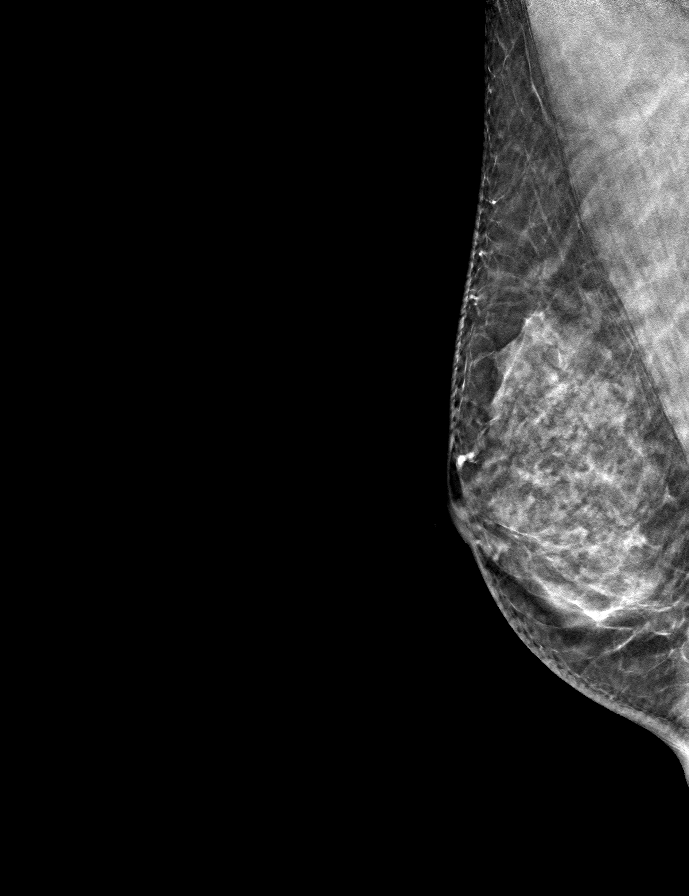

[L CC tomo · tomo slice 29/57.0]
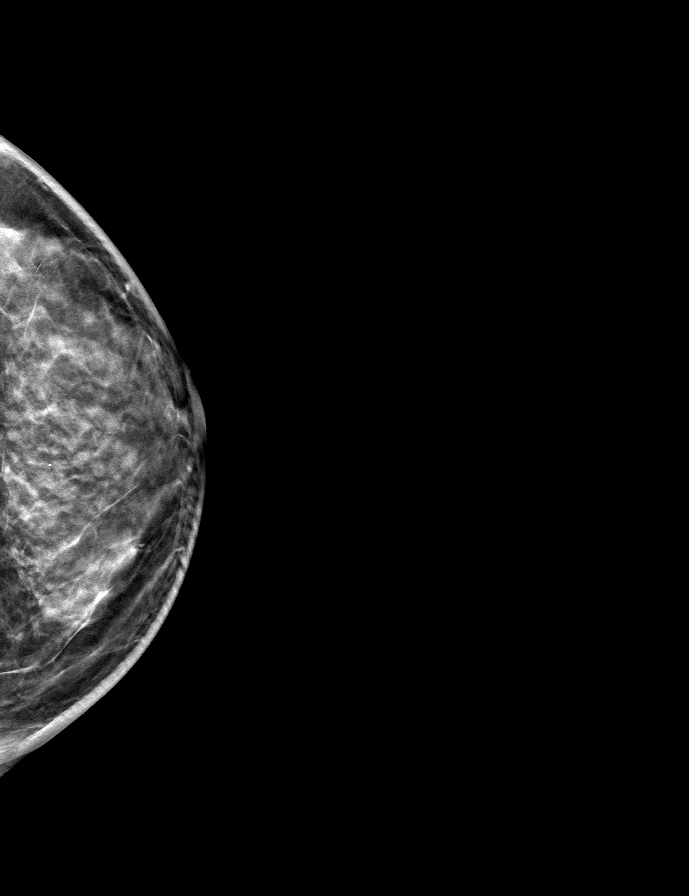

[L MLO tomo · tomo slice 29/56.0]
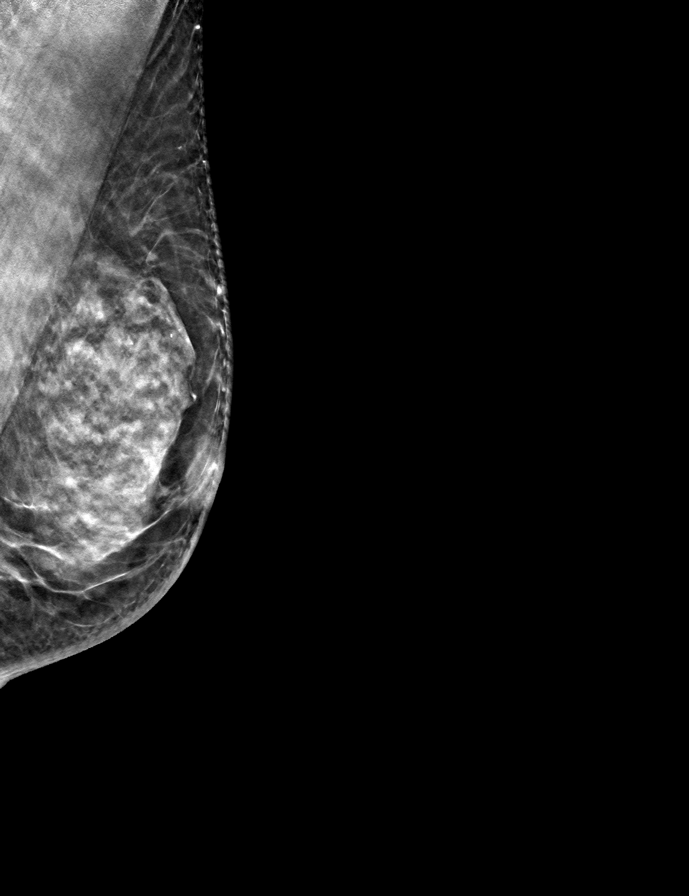

[R CC tomo · tomo slice 28/55.0]
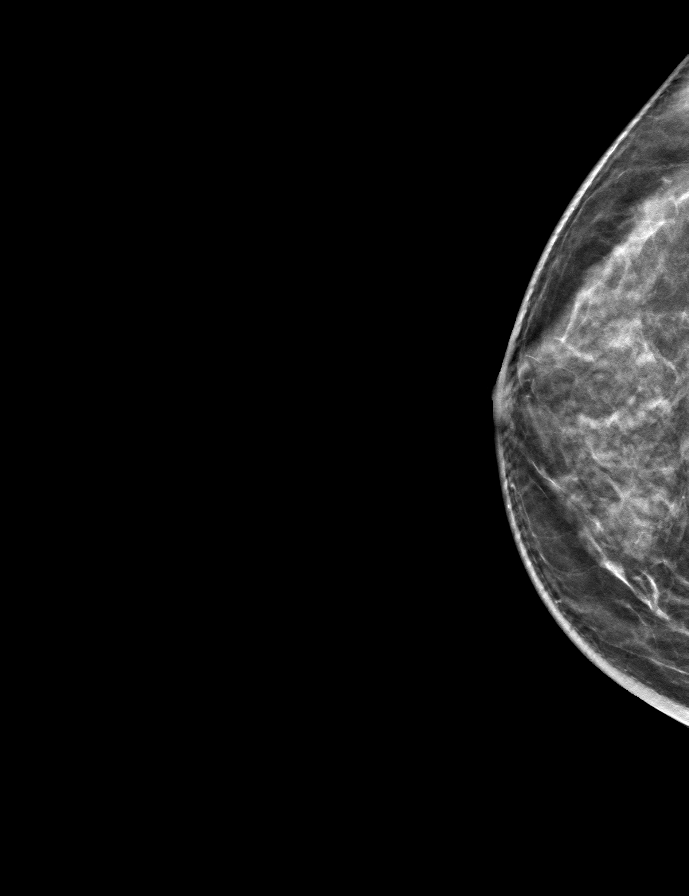

[9 of 24 positions shown; findings below may reference images not displayed]

ACR Breast Density Category c: The breast tissue is heterogeneously
dense, which may obscure small masses.
FINDINGS: There are no findings suspicious for malignancy. Images were
processed with CAD.
IMPRESSION: No mammographic evidence of malignancy. A result letter of this
screening mammogram will be mailed directly to the patient.

RECOMMENDATION:
Screening mammogram in one year. (Code:[id number])

BI-RADS CATEGORY  1: Negative.

## 2021-10-06 ENCOUNTER — Ambulatory Visit: Payer: Managed Care, Other (non HMO) | Admitting: Family Medicine

## 2021-10-08 ENCOUNTER — Encounter: Payer: Self-pay | Admitting: Family Medicine

## 2021-10-08 ENCOUNTER — Ambulatory Visit: Payer: Managed Care, Other (non HMO) | Admitting: Family Medicine

## 2021-10-08 VITALS — BP 128/78 | HR 68 | Temp 98.2°F | Resp 16 | Ht 61.0 in | Wt 126.0 lb

## 2021-10-08 DIAGNOSIS — J0140 Acute pansinusitis, unspecified: Secondary | ICD-10-CM | POA: Insufficient documentation

## 2021-10-08 DIAGNOSIS — J40 Bronchitis, not specified as acute or chronic: Secondary | ICD-10-CM | POA: Insufficient documentation

## 2021-10-08 MED ORDER — PROMETHAZINE-DM 6.25-15 MG/5ML PO SYRP: 5.0000 mL | ORAL_SOLUTION | Freq: Four times a day (QID) | ORAL | 0 refills | Status: DC | PRN

## 2021-10-08 MED ORDER — AMOXICILLIN-POT CLAVULANATE 875-125 MG PO TABS: 1.0000 | ORAL_TABLET | Freq: Two times a day (BID) | ORAL | 0 refills | Status: DC

## 2021-10-08 MED ORDER — PREDNISONE 10 MG PO TABS: ORAL_TABLET | ORAL | 0 refills | Status: DC

## 2021-10-08 NOTE — Assessment & Plan Note (Signed)
abx per orders  pred taper  Cough syrup per orders

## 2021-10-08 NOTE — Patient Instructions (Signed)
Acute Bronchitis, Adult °Acute bronchitis is sudden inflammation of the main airways (bronchi) that come off the windpipe (trachea) in the lungs. The swelling causes the airways to get smaller and make more mucus than normal. This can make it hard to breathe and can cause coughing or noisy breathing (wheezing). °Acute bronchitis may last several weeks. The cough may last longer. Allergies, asthma, and exposure to smoke may make the condition worse. °What are the causes? °This condition can be caused by germs and by substances that irritate the lungs, including: °Cold and flu viruses. The most common cause of this condition is the virus that causes the common cold. °Bacteria. This is less common. °Breathing in substances that irritate the lungs, including: °Smoke from cigarettes and other forms of tobacco. °Dust and pollen. °Fumes from household cleaning products, gases, or burned fuel. °Indoor or outdoor air pollution. °What increases the risk? °The following factors may make you more likely to develop this condition: °A weak body's defense system, also called the immune system. °A condition that affects your lungs and breathing, such as asthma. °What are the signs or symptoms? °Common symptoms of this condition include: °Coughing. This may bring up clear, yellow, or green mucus from your lungs (sputum). °Wheezing. °Runny or stuffy nose. °Having too much mucus in your lungs (chest congestion). °Shortness of breath. °Aches and pains, including sore throat or chest. °How is this diagnosed? °This condition is usually diagnosed based on: °Your symptoms and medical history. °A physical exam. °You may also have other tests, including tests to rule out other conditions, such as pneumonia. These tests include: °A test of lung function. °Test of a mucus sample to look for the presence of bacteria. °Tests to check the oxygen level in your blood. °Blood tests. °Chest X-ray. °How is this treated? °Most cases of acute bronchitis  clear up over time without treatment. Your health care provider may recommend: °Drinking more fluids to help thin your mucus so it is easier to cough up. °Taking inhaled medicine (inhaler) to improve air flow in and out of your lungs. °Using a vaporizer or a humidifier. These are machines that add water to the air to help you breathe better. °Taking a medicine that thins mucus and clears congestion (expectorant). °Taking a medicine that prevents or stops coughing (cough suppressant). °It is notcommon to take an antibiotic medicine for this condition. °Follow these instructions at home: ° °Take over-the-counter and prescription medicines only as told by your health care provider. °Use an inhaler, vaporizer, or humidifier as told by your health care provider. °Take two teaspoons (10 mL) of honey at bedtime to lessen coughing at night. °Drink enough fluid to keep your urine pale yellow. °Do not use any products that contain nicotine or tobacco. These products include cigarettes, chewing tobacco, and vaping devices, such as e-cigarettes. If you need help quitting, ask your health care provider. °Get plenty of rest. °Return to your normal activities as told by your health care provider. Ask your health care provider what activities are safe for you. °Keep all follow-up visits. This is important. °How is this prevented? °To lower your risk of getting this condition again: °Wash your hands often with soap and water for at least 20 seconds. If soap and water are not available, use hand sanitizer. °Avoid contact with people who have cold symptoms. °Try not to touch your mouth, nose, or eyes with your hands. °Avoid breathing in smoke or chemical fumes. Breathing smoke or chemical fumes will make your condition   worse. °Get the flu shot every year. °Contact a health care provider if: °Your symptoms do not improve after 2 weeks. °You have trouble coughing up the mucus. °Your cough keeps you awake at night. °You have a  fever. °Get help right away if you: °Cough up blood. °Feel pain in your chest. °Have severe shortness of breath. °Faint or keep feeling like you are going to faint. °Have a severe headache. °Have a fever or chills that get worse. °These symptoms may represent a serious problem that is an emergency. Do not wait to see if the symptoms will go away. Get medical help right away. Call your local emergency services (911 in the [place] Do not drive yourself to the hospital. °Summary °Acute bronchitis is inflammation of the main airways (bronchi) that come off the windpipe (trachea) in the lungs. The swelling causes the airways to get smaller and make more mucus than normal. °Drinking more fluids can help thin your mucus so it is easier to cough up. °Take over-the-counter and prescription medicines only as told by your health care provider. °Do not use any products that contain nicotine or tobacco. These products include cigarettes, chewing tobacco, and vaping devices, such as e-cigarettes. If you need help quitting, ask your health care provider. °Contact a health care provider if your symptoms do not improve after 2 weeks. °This information is not intended to replace advice given to you by your health care provider. Make sure you discuss any questions you have with your health care provider. °Document Revised: 12/10/2020 Document Reviewed: 12/10/2020 °Elsevier Patient Education © 2022 [employer] Inc. ° °

## 2021-10-08 NOTE — Progress Notes (Signed)
Subjective:   By signing my name below, I, Leslie Deleon, attest that this documentation has been prepared under the direction and in the presence of Ann Held, DO  10/08/2021    Patient ID: Leslie Deleon, female    DOB: Jun 20, 1961, 61 y.o.   MRN: [id number]  Chief Complaint  Patient presents with   Sore Throat    Here for Complains of Sore throat ,coughing Pt stated negative Covid test on Sunday     Sore Throat  Associated symptoms include congestion, coughing and headaches.  Patient is in today for a office visit. She is present with her husband during this visit.  She complains of headaches, congestion, sore throat, cough and coughing up yellow/green mucous, chest congestion, hoarse voice, sinus pressure for the past 3 days. Her cough is worse at night and disturbs her sleep. Her sinus pressure is concentrated on her frontal sinus. She feels her chest is tight and is causing her to have increased labored breaths. She also reports having a fevers prior to this visit of 99-100.8 degrees F. She does not have a fever or sore throat at this time. She tested negative for Covid-19. She is using a neti pot to clear her sinus and reports doing well. She is also taking mucinex and finds relief. She has no know allergies to anti-biotics. She is struggling to stay awake during the day and is drink 2 instead of 1 cup of coffee.    Past Medical History:  Diagnosis Date   Endometrial cancer [facility])    Endometrial hyperplasia with atypia    s/p hysterectomy   Kidney infection age [age]   hospitalized for 2 days , source from UTI infection     Past Surgical History:  Procedure Laterality Date   CESAREAN SECTION     COLONOSCOPY  2015/2016   with [place]     polypectomy   HYSTEROSCOPY  11/26/2015   with scope showing pre-cancerous stage 0 cells per patient    ROBOTIC ASSISTED TOTAL HYSTERECTOMY WITH BILATERAL SALPINGO OOPHERECTOMY  N/A 07/19/2017   Procedure: XI ROBOTIC ASSISTED TOTAL HYSTERECTOMY WITH BILATERAL SALPINGECTOMY;  Surgeon: Everitt Amber, MD;  Location: [facility];  Service: Gynecology;  Laterality: N/A;   SENTINEL NODE BIOPSY N/A 07/19/2017   Procedure: SENTINEL NODE BIOPSY;  Surgeon: Everitt Amber, MD;  Location: [facility];  Service: Gynecology;  Laterality: N/A;    Family History  Problem Relation Age of Onset   Diabetes Mother    Hypertension Mother    Hypertension Father    Liver cancer Father    Hepatitis B Father    Diabetes Maternal Grandmother    Healthy Daughter    Kidney disease Son    Depression Brother    Suicidality Brother    Bipolar disorder Brother    Breast cancer Neg Hx     Social History   Socioeconomic History   Marital status: Married    Spouse name: Not on file   Number of children: Not on file   Years of education: Not on file   Highest education level: Not on file  Occupational History   Occupation: Freight forwarder of translators    Employer: [employer]  Tobacco Use   Smoking status: Never   Smokeless tobacco: Never  Vaping Use   Vaping Use: Never used  Substance and Sexual Activity   Alcohol use: No   Drug use: No   Sexual activity: Yes  Birth control/protection: Condom  Other Topics Concern   Not on file  Social History Narrative   Originally from [place], married, one daughter   Social Determinants of Health   Financial Resource Strain: Not on file  Food Insecurity: Not on file  Transportation Needs: Not on file  Physical Activity: Not on file  Stress: Not on file  Social Connections: Not on file  Intimate Partner Violence: Not on file    Outpatient Medications Prior to Visit  Medication Sig Dispense Refill   glucose blood test strip Use to check glucose up to 3 times daily 100 each 3   Multiple Vitamins-Minerals (MULTIVITAMIN PO) Take 1 tablet daily by mouth.     Omega-3 Fatty Acids (FISH OIL) 1000 MG CAPS Take 2,000 mg daily by mouth.     OneTouch  Delica Lancets 41D MISC USE AS DIRECTED. Dx: E11.9 100 each 12   No facility-administered medications prior to visit.    No Known Allergies  Review of Systems  HENT:  Positive for congestion and sinus pain (sinus pressure).        (+)hoarse voice  Respiratory:  Positive for cough and sputum production (yellow/green).        (+)chest congestion (+)chest congestion  Neurological:  Positive for headaches.      Objective:    Physical Exam Constitutional:      General: She is not in acute distress.    Appearance: Normal appearance. She is not ill-appearing.  HENT:     Head: Normocephalic and atraumatic.     Right Ear: Tympanic membrane, ear canal and external ear normal.     Left Ear: Tympanic membrane, ear canal and external ear normal.  Eyes:     Extraocular Movements: Extraocular movements intact.     Pupils: Pupils are equal, round, and reactive to light.  Cardiovascular:     Rate and Rhythm: Normal rate and regular rhythm.     Heart sounds: Normal heart sounds. No murmur heard.   No gallop.  Pulmonary:     Effort: Pulmonary effort is normal. No respiratory distress.     Breath sounds: Normal breath sounds. No wheezing or rales.  Skin:    General: Skin is warm and dry.  Neurological:     Mental Status: She is alert and oriented to person, place, and time.  Psychiatric:        Behavior: Behavior normal.        Judgment: Judgment normal.    BP 128/78 (BP Location: Right Arm, Patient Position: Sitting, Cuff Size: Small)    Pulse 68    Temp 98.2 F (36.8 C) (Oral)    Resp 16    Ht 5\' 1"  (1.549 m)    Wt 126 lb (57.2 kg)    LMP 05/03/2017    SpO2 98%    BMI 23.81 kg/m  Wt Readings from Last 3 Encounters:  10/08/21 126 lb (57.2 kg)  07/29/21 127 lb 3.2 oz (57.7 kg)  02/12/21 125 lb 9.6 oz (57 kg)    Diabetic Foot Exam - Simple   No data filed    Lab Results  Component Value Date   WBC 3.9 07/23/2021   HGB 14.0 07/23/2021   HCT 42 07/23/2021   PLT 209 07/23/2021    GLUCOSE 106 (H) 07/11/2017   CHOL 176 07/23/2021   TRIG 84 07/23/2021   HDL 84 (A) 07/23/2021   LDLCALC 77 07/23/2021   ALT 21 07/23/2021   AST 25 07/23/2021   NA  141 07/23/2021   K 4.0 07/23/2021   CL 105 07/23/2021   CREATININE 0.8 07/23/2021   BUN 16 07/23/2021   CO2 24 (A) 07/23/2021   TSH 2.63 07/23/2021   HGBA1C 5.8 07/23/2021    Lab Results  Component Value Date   TSH 2.63 07/23/2021   Lab Results  Component Value Date   WBC 3.9 07/23/2021   HGB 14.0 07/23/2021   HCT 42 07/23/2021   MCV 92.2 07/11/2017   PLT 209 07/23/2021   Lab Results  Component Value Date   NA 141 07/23/2021   K 4.0 07/23/2021   CO2 24 (A) 07/23/2021   GLUCOSE 106 (H) 07/11/2017   BUN 16 07/23/2021   CREATININE 0.8 07/23/2021   BILITOT 0.7 07/11/2017   ALKPHOS 54 07/11/2017   AST 25 07/23/2021   ALT 21 07/23/2021   PROT 7.6 07/11/2017   ALBUMIN 4.9 07/23/2021   CALCIUM 9.7 07/23/2021   ANIONGAP 7 07/11/2017   Lab Results  Component Value Date   CHOL 176 07/23/2021   Lab Results  Component Value Date   HDL 84 (A) 07/23/2021   Lab Results  Component Value Date   LDLCALC 77 07/23/2021   Lab Results  Component Value Date   TRIG 84 07/23/2021   No results found for: CHOLHDL Lab Results  Component Value Date   HGBA1C 5.8 07/23/2021       Assessment & Plan:   Problem List Items Addressed This Visit       Unprioritized   Acute non-recurrent pansinusitis - Primary   Relevant Medications   amoxicillin-clavulanate (AUGMENTIN) 875-125 MG tablet   predniSONE (DELTASONE) 10 MG tablet   promethazine-dextromethorphan (PROMETHAZINE-DM) 6.25-15 MG/5ML syrup   Bronchitis    abx per orders  pred taper  Cough syrup per orders       Relevant Medications   amoxicillin-clavulanate (AUGMENTIN) 875-125 MG tablet   predniSONE (DELTASONE) 10 MG tablet   promethazine-dextromethorphan (PROMETHAZINE-DM) 6.25-15 MG/5ML syrup     Meds ordered this encounter  Medications    amoxicillin-clavulanate (AUGMENTIN) 875-125 MG tablet    Sig: Take 1 tablet by mouth 2 (two) times daily.    Dispense:  20 tablet    Refill:  0   predniSONE (DELTASONE) 10 MG tablet    Sig: TAKE 3 TABLETS PO QD FOR 3 DAYS THEN TAKE 2 TABLETS PO QD FOR 3 DAYS THEN TAKE 1 TABLET PO QD FOR 3 DAYS THEN TAKE 1/2 TAB PO QD FOR 3 DAYS    Dispense:  20 tablet    Refill:  0   promethazine-dextromethorphan (PROMETHAZINE-DM) 6.25-15 MG/5ML syrup    Sig: Take 5 mLs by mouth 4 (four) times daily as needed.    Dispense:  118 mL    Refill:  0    I, Ann Held, DO, personally preformed the services described in this documentation.  All medical record entries made by the scribe were at my direction and in my presence.  I have reviewed the chart and discharge instructions (if applicable) and agree that the record reflects my personal performance and is accurate and complete. 10/08/2021   I,Leslie Deleon,acting as a scribe for Ann Held, DO.,have documented all relevant documentation on the behalf of Ann Held, DO,as directed by  Ann Held, DO while in the presence of Ann Held, DO.   Ann Held, DO

## 2021-12-04 ENCOUNTER — Encounter: Payer: Self-pay | Admitting: Family Medicine

## 2021-12-09 NOTE — Telephone Encounter (Signed)
Dr. Jonni Sanger, pt is having rectal bleeding again. Her Colonoscopy from 2016 is in the chart. Please advise and see pt's message. ?

## 2022-05-17 ENCOUNTER — Encounter: Payer: Self-pay | Admitting: *Deleted

## 2022-06-11 ENCOUNTER — Encounter: Payer: Self-pay | Admitting: Family Medicine

## 2022-07-23 ENCOUNTER — Encounter: Payer: Managed Care, Other (non HMO) | Admitting: Family Medicine

## 2022-08-03 LAB — CBC AND DIFFERENTIAL
HCT: 43 (ref 36–46)
Hemoglobin: 14.2 (ref 12.0–16.0)
Neutrophils Absolute: 2.4
Platelets: 210 10*3/uL (ref 150–400)
WBC: 4.2

## 2022-08-03 LAB — LIPID PANEL
Cholesterol: 229 — AB (ref 0–200)
HDL: 97 — AB (ref 35–70)
LDL Cholesterol: 121
Triglycerides: 66 (ref 40–160)

## 2022-08-03 LAB — HEPATIC FUNCTION PANEL
ALT: 56 U/L — AB (ref 7–35)
AST: 37 — AB (ref 13–35)
Alkaline Phosphatase: 68 (ref 25–125)
Bilirubin, Total: 0.8

## 2022-08-03 LAB — COMPREHENSIVE METABOLIC PANEL
Albumin: 4.9 (ref 3.5–5.0)
Calcium: 9.6 (ref 8.7–10.7)
Globulin: 2.7
eGFR: 96

## 2022-08-03 LAB — VITAMIN D 25 HYDROXY (VIT D DEFICIENCY, FRACTURES): Vit D, 25-Hydroxy: 41.1

## 2022-08-03 LAB — TSH: TSH: 2.82 (ref 0.41–5.90)

## 2022-08-03 LAB — BASIC METABOLIC PANEL
BUN: 17 (ref 4–21)
CO2: 23 — AB (ref 13–22)
Chloride: 104 (ref 99–108)
Creatinine: 0.7 (ref 0.5–1.1)
Glucose: 94
Potassium: 3.9 mEq/L (ref 3.5–5.1)
Sodium: 143 (ref 137–147)

## 2022-08-03 LAB — HEMOGLOBIN A1C: Hemoglobin A1C: 5.8

## 2022-08-03 LAB — CBC: RBC: 4.53 (ref 3.87–5.11)

## 2022-08-04 ENCOUNTER — Encounter: Payer: Self-pay | Admitting: Family Medicine

## 2022-08-04 ENCOUNTER — Ambulatory Visit (INDEPENDENT_AMBULATORY_CARE_PROVIDER_SITE_OTHER): Payer: Managed Care, Other (non HMO) | Admitting: Family Medicine

## 2022-08-04 VITALS — BP 120/78 | HR 62 | Temp 98.7°F | Ht 61.0 in | Wt 120.4 lb

## 2022-08-04 DIAGNOSIS — R7303 Prediabetes: Secondary | ICD-10-CM

## 2022-08-04 DIAGNOSIS — Z0000 Encounter for general adult medical examination without abnormal findings: Secondary | ICD-10-CM

## 2022-08-04 DIAGNOSIS — Z1231 Encounter for screening mammogram for malignant neoplasm of breast: Secondary | ICD-10-CM

## 2022-08-04 DIAGNOSIS — Z8742 Personal history of other diseases of the female genital tract: Secondary | ICD-10-CM

## 2022-08-04 DIAGNOSIS — K635 Polyp of colon: Secondary | ICD-10-CM | POA: Diagnosis not present

## 2022-08-04 DIAGNOSIS — Z1159 Encounter for screening for other viral diseases: Secondary | ICD-10-CM | POA: Diagnosis not present

## 2022-08-04 DIAGNOSIS — N952 Postmenopausal atrophic vaginitis: Secondary | ICD-10-CM

## 2022-08-04 MED ORDER — ESTRADIOL 0.1 MG/GM VA CREA: 1.0000 | TOPICAL_CREAM | VAGINAL | 3 refills | Status: DC

## 2022-08-04 NOTE — Patient Instructions (Signed)
Please return in 12 months for your annual complete physical; please come fasting.   Please get a Hep C antibody screen with [facility].   If you have any questions or concerns, please don't hesitate to send me a message via MyChart or call the office at [phone]. Thank you for visiting with [place] today! It's our pleasure caring for you.   I have ordered a mammogram and/or bone density for you as we discussed today: '[x]'$   Mammogram  '[]'$   Bone Density  Please call the office checked below to schedule your appointment:  '[x]'$   [facility]      [place], [place]         '[]'$   [place], [place]  Atrophic Vaginitis  Atrophic vaginitis is a condition in which the tissues that line the vagina become dry and thin. This condition is most common in women who have stopped having regular menstrual periods (are in menopause). This usually starts when a woman is 28 to 59 years old. That is the time when a woman's estrogen levels begin to decrease. Estrogen is a female hormone. It helps to keep the tissues of the vagina moist. It stimulates the vagina to produce a clear fluid that lubricates the vagina for sex. This fluid also protects the vagina from infection. Lack of estrogen can cause the lining of the vagina to get thinner and dryer. The vagina may also shrink in size. It may become less elastic. Atrophic vaginitis tends to get worse over time as a woman's estrogen level drops. What are the causes? This condition is caused by the normal drop in estrogen that happens around the time of menopause. What increases the risk? Certain conditions or situations may lower a woman's estrogen level, leading to a higher risk for atrophic vaginitis. You are more likely to develop this condition if: You are taking medicines that block estrogen. You have had your ovaries removed. You are being  treated for cancer with radiation or medicines (chemotherapy). You have given birth or are breastfeeding. You are older than age 107. You smoke. What are the signs or symptoms? Symptoms of this condition include: Pain, soreness, a feeling of pressure, or bleeding during sex (dyspareunia). Vaginal burning, irritation, or itching. Pain or bleeding when a speculum is used in a vaginal exam. Having burning pain while urinating. Vaginal discharge. In some cases, there are no symptoms. How is this diagnosed? This condition is diagnosed based on your medical history and a physical exam. This will include a pelvic exam that checks the vaginal tissues. Though rare, you may also have other tests, including: A urine test. A test that checks the acid balance in your vagina (acid balance test). How is this treated? Treatment for this condition depends on how severe your symptoms are. Treatment may include: Using an over-the-counter vaginal lubricant before sex. Using a long-acting vaginal moisturizer. Using low-dose estrogen for moderate to severe symptoms that do not respond to other treatments. Options include creams, tablets, and inserts (vaginal rings). Before you use a vaginal estrogen, tell your health care provider if you have a history of: Breast cancer. Endometrial cancer. Blood clots. If you are not sexually active and your symptoms are very mild, you may not need treatment. Follow these instructions at home: Medicines Take over-the-counter and prescription medicines only as told  by your health care provider. Do not use herbal or alternative medicines unless your health care provider says that you can. Use over-the-counter creams, lubricants, or moisturizers for dryness only as told by your health care provider. General instructions If your atrophic vaginitis is caused by menopause, discuss all of your menopause symptoms and treatment options with your health care provider. Do not  douche. Do not use products that can make your vagina dry. These include: Scented feminine sprays. Scented tampons. Scented soaps. Vaginal sex can help to improve blood flow and elasticity of vaginal tissue. If you choose to have sex and it hurts, try using a water-soluble lubricant or moisturizer right before having sex. Contact a health care provider if: Your discharge looks different than normal. Your vagina has an unusual smell. You have new symptoms. Your symptoms do not improve with treatment. Your symptoms get worse. Summary Atrophic vaginitis is a condition in which the tissues that line the vagina become dry and thin. It is most common in women who have stopped having regular menstrual periods (are in menopause). Treatment options include using vaginal lubricants and low-dose vaginal estrogen. Contact a health care provider if your vagina has an unusual smell, or if your symptoms get worse or do not improve after treatment. This information is not intended to replace advice given to you by your health care provider. Make sure you discuss any questions you have with your health care provider. Document Revised: 02/07/2020 Document Reviewed: 02/07/2020 Elsevier Patient Education  [employer].

## 2022-08-04 NOTE — Progress Notes (Signed)
Subjective  Chief Complaint  Patient presents with   Annual Exam    Pt here for Annual exam and is not currently fasting     HPI: Leslie Deleon is a 61 y.o. female who presents to [facility] [place] today for a Female Wellness Visit. She also has the concerns and/or needs as listed above in the chief complaint. These will be addressed in addition to the Health Maintenance Visit.   Wellness Visit: annual visit with health maintenance review and exam without Pap  Health maintenance: Just returned from [place] visiting her parents.  Overall doing well.  Reviewed lab work from home workplace dated 12/12.  Normal CBC, CMP, TSH, vitamin D.  Lipid panel significant for total cholesterol 212, LDL 121 and HDL 97.  Overdue for mammogram, patient to schedule.  Colorectal cancer screening with colonoscopy scheduled for January after rectal bleeding eval last year.  She did have an anal fissure at that time.  She sees Dr. Collene Mares. she declines flu shot at this time Chronic disease f/u and/or acute problem visit: (deemed necessary to be done in addition to the wellness visit): Prediabetes: Most recent A1c from yesterday was 5.8.  Diet continues to be healthy overall.  She does have a sweet tooth.  No symptoms of hyperglycemia. Patient complains of painful intercourse intermittently over the last several months.  She did try an over-the-counter lubricant prior to intercourse with mild relief.  No vaginal bleeding.  No discharge.  No risk of STDs.  Monogamous, married.  She is postmenopausal.  Hysterectomy 2018.  Assessment  1. Annual physical exam   2. Polyp of colon, unspecified part of colon, unspecified type   3. Prediabetes   4. Need for hepatitis C screening test   5. Encounter for screening mammogram for breast cancer   6. Vaginal atrophy   7. History of endometrial hyperplasia s/p hysterectomy and bil salpingectomy      Plan  Female Wellness Visit: Age appropriate Health  Maintenance and Prevention measures were discussed with patient. Included topics are cancer screening recommendations, ways to keep healthy (see AVS) including dietary and exercise recommendations, regular eye and dental care, use of seat belts, and avoidance of moderate alcohol use and tobacco use.  Patient to schedule mammogram.  Patient to get hepatitis C screening at her office. BMI: discussed patient's BMI and encouraged positive lifestyle modifications to help get to or maintain a target BMI. HM needs and immunizations were addressed and ordered. See below for orders. See HM and immunization section for updates. Routine labs and screening tests ordered including cmp, cbc and lipids where appropriate. Discussed recommendations regarding Vit D and calcium supplementation (see AVS)  Chronic disease management visit and/or acute problem visit: Prediabetes: Reassured.  Monitor.  Lessen sweets Vaginal atrophy causing painful intercourse: Education given.  Estrogen cream 2-3 times weekly as needed.  Follow-up if not improving.  Follow up: 12 mo for cpe  Orders Placed This Encounter  Procedures   MM DIGITAL SCREENING BILATERAL   Hepatitis C antibody   Comprehensive metabolic panel   CBC with Differential/Platelet   Lipid panel   Hemoglobin A1c   Meds ordered this encounter  Medications   estradiol (ESTRACE) 0.1 MG/GM vaginal cream    Sig: Place 1 Applicatorful vaginally 3 (three) times a week. As needed    Dispense:  42.5 g    Refill:  3    Dose is 1gm PV 3x/week PRN      Body mass  index is 22.75 kg/m. Wt Readings from Last 3 Encounters:  08/04/22 120 lb 6.4 oz (54.6 kg)  10/08/21 126 lb (57.2 kg)  07/29/21 127 lb 3.2 oz (57.7 kg)     Patient Active Problem List   Diagnosis Date Noted   Diverticulosis 07/29/2021    Large mouth by colonoscopy 2016    Colon polyp 07/29/2021    Colonoscopy 2016 diverticulum and hyperplastic polyp, q 10 years. Dr. Collene Mares    Prediabetes  07/29/2021   History of endometrial hyperplasia s/p hysterectomy and bil salpingectomy 06/27/2017   Health Maintenance  Topic Date Due   Hepatitis C Screening  Never done   MAMMOGRAM  06/15/2022   COVID-19 Vaccine (3 - 2023-24 season) 08/20/2022 (Originally 04/23/2022)   INFLUENZA VACCINE  11/21/2022 (Originally 03/23/2022)   COLONOSCOPY (Pts 45-62yr Insurance coverage will need to be confirmed)  11/28/2024   DTaP/Tdap/Td (2 - Td or Tdap) 01/28/2028   Zoster Vaccines- Shingrix  Completed   HPV VACCINES  Aged Out   PAP SMEAR-Modifier  Discontinued   HIV Screening  Discontinued   Immunization History  Administered Date(s) Administered   Moderna Sars-Covid-2 Vaccination 11/12/2019, 12/14/2019   Tdap 01/27/2018   Zoster Recombinat (Shingrix) 02/16/2018, 06/23/2018   We updated and reviewed the patient's past history in detail and it is documented below. Allergies: Patient has No Known Allergies. Past Medical History Patient  has a past medical history of [place] Endometrial hyperplasia with atypia, and Kidney infection (age 59-25. Past Surgical History Patient  has a past surgical history that includes Cesarean section; Dilatation & curettage/hysteroscopy with myosure; Hysteroscopy (11/26/2015); Colonoscopy (2015/2016); Robotic assisted total hysterectomy with bilateral salpingo oophorectomy (N/A, 07/19/2017); and Sentinel node biopsy (N/A, 07/19/2017). Family History: Patient family history includes Bipolar disorder in her brother; Depression in her brother; Diabetes in her maternal grandmother and mother; Healthy in her daughter; Hepatitis B in her father; Hypertension in her father and mother; Kidney disease in her son; Liver cancer in her father; Suicidality in her brother. Social History:  Patient  reports that she has never smoked. She has never used smokeless tobacco. She reports that she does not drink alcohol and does not use drugs.  Review of  Systems: Constitutional: negative for fever or malaise Ophthalmic: negative for photophobia, double vision or loss of vision Cardiovascular: negative for chest pain, dyspnea on exertion, or new LE swelling Respiratory: negative for SOB or persistent cough Gastrointestinal: negative for abdominal pain, change in bowel habits or melena Genitourinary: negative for dysuria or gross hematuria, no abnormal uterine bleeding or disharge Musculoskeletal: negative for new gait disturbance or muscular weakness Integumentary: negative for new or persistent rashes, no breast lumps Neurological: negative for TIA or stroke symptoms Psychiatric: negative for SI or delusions Allergic/Immunologic: negative for hives  Patient Care Team    Relationship Specialty Notifications Start End  ALeamon Arnt MD PCP - General Family Medicine  02/15/18   RPaula Compton MD Consulting Physician Obstetrics and Gynecology  01/27/18   SSherlynn Stalls MD Consulting Physician Ophthalmology  06/04/20   REveritt Amber MD  Gynecologic Oncology  07/29/21   MJuanita Craver MD Consulting Physician Gastroenterology  08/04/22     Objective  Vitals: BP 120/78   Pulse 62   Temp 98.7 F (37.1 C)   Ht '5\' 1"'$  (1.549 m)   Wt 120 lb 6.4 oz (54.6 kg)   LMP 05/03/2017   SpO2 99%   BMI 22.75 kg/m  General:  Well developed, well nourished, no acute  distress  Psych:  Alert and orientedx3,normal mood and affect HEENT:  Normocephalic, atraumatic, non-icteric sclera,  supple neck without adenopathy, mass or thyromegaly Cardiovascular:  Normal S1, S2, RRR without gallop, rub or murmur Respiratory:  Good breath sounds bilaterally, CTAB with normal respiratory effort Gastrointestinal: normal bowel sounds, soft, non-tender, no noted masses. No HSM MSK: no deformities, contusions. Joints are without erythema or swelling.  Skin:  Warm, no rashes or suspicious lesions noted Neurologic:    Mental status is normal. CN 2-11 are normal. Gross  motor and sensory exams are normal. Normal gait. No tremor   Commons side effects, risks, benefits, and alternatives for medications and treatment plan prescribed today were discussed, and the patient expressed understanding of the given instructions. Patient is instructed to call or message via MyChart if he/she has any questions or concerns regarding our treatment plan. No barriers to understanding were identified. We discussed Red Flag symptoms and signs in detail. Patient expressed understanding regarding what to do in case of urgent or emergency type symptoms.  Medication list was reconciled, printed and provided to the patient in AVS. Patient instructions and summary information was reviewed with the patient as documented in the AVS. This note was prepared with assistance of Dragon voice recognition software. Occasional wrong-word or sound-a-like substitutions may have occurred due to the inherent limitations of voice recognition software

## 2022-08-10 ENCOUNTER — Encounter: Payer: Self-pay | Admitting: Family Medicine

## 2022-08-11 MED ORDER — NIRMATRELVIR/RITONAVIR (PAXLOVID)TABLET: 3.0000 | ORAL_TABLET | Freq: Two times a day (BID) | ORAL | 0 refills | Status: AC

## 2022-10-05 ENCOUNTER — Ambulatory Visit
Admission: RE | Admit: 2022-10-05 | Discharge: 2022-10-05 | Disposition: A | Discharge status: Home / Self Care | Payer: Managed Care, Other (non HMO) | Source: Ambulatory Visit | Attending: Family Medicine | Admitting: Family Medicine

## 2022-10-05 DIAGNOSIS — Z1231 Encounter for screening mammogram for malignant neoplasm of breast: Secondary | ICD-10-CM

## 2022-10-08 ENCOUNTER — Other Ambulatory Visit: Payer: Self-pay | Admitting: Family Medicine

## 2022-10-08 DIAGNOSIS — R928 Other abnormal and inconclusive findings on diagnostic imaging of breast: Secondary | ICD-10-CM

## 2022-10-18 ENCOUNTER — Ambulatory Visit
Admission: RE | Admit: 2022-10-18 | Discharge: 2022-10-18 | Disposition: A | Discharge status: Home / Self Care | Payer: Managed Care, Other (non HMO) | Source: Ambulatory Visit | Attending: Family Medicine | Admitting: Family Medicine

## 2022-10-18 DIAGNOSIS — R928 Other abnormal and inconclusive findings on diagnostic imaging of breast: Secondary | ICD-10-CM

## 2022-11-04 ENCOUNTER — Other Ambulatory Visit: Payer: Self-pay | Admitting: Family Medicine

## 2022-11-04 DIAGNOSIS — R921 Mammographic calcification found on diagnostic imaging of breast: Secondary | ICD-10-CM

## 2023-01-22 LAB — HM HEPATITIS C SCREENING LAB: HM Hepatitis Screen: NEGATIVE

## 2023-02-18 ENCOUNTER — Encounter: Payer: Self-pay | Admitting: Family Medicine

## 2023-05-11 ENCOUNTER — Ambulatory Visit
Admission: RE | Admit: 2023-05-11 | Discharge: 2023-05-11 | Disposition: A | Discharge status: Home / Self Care | Payer: Managed Care, Other (non HMO) | Source: Ambulatory Visit | Attending: Family Medicine | Admitting: Family Medicine

## 2023-05-11 DIAGNOSIS — R921 Mammographic calcification found on diagnostic imaging of breast: Secondary | ICD-10-CM

## 2023-05-12 ENCOUNTER — Other Ambulatory Visit: Payer: Self-pay | Admitting: Family Medicine

## 2023-05-12 DIAGNOSIS — R928 Other abnormal and inconclusive findings on diagnostic imaging of breast: Secondary | ICD-10-CM

## 2023-08-03 LAB — BASIC METABOLIC PANEL
Creatinine: 0.8 (ref 0.5–1.1)
Glucose: 92
Potassium: 4.1 meq/L (ref 3.5–5.1)
Sodium: 138 (ref 137–147)

## 2023-08-03 LAB — HEPATIC FUNCTION PANEL
ALT: 30 U/L (ref 7–35)
AST: 28 (ref 13–35)

## 2023-08-03 LAB — LIPID PANEL
Cholesterol: 186 (ref 0–200)
HDL: 79 — AB (ref 35–70)
LDL Cholesterol: 95
Triglycerides: 61 (ref 40–160)

## 2023-08-03 LAB — HEMOGLOBIN A1C: Hemoglobin A1C: 5.8

## 2023-08-03 LAB — CBC AND DIFFERENTIAL
Hemoglobin: 13.7 (ref 12.0–16.0)
Platelets: 216 10*3/uL (ref 150–400)
WBC: 3.3

## 2023-08-03 LAB — VITAMIN D 25 HYDROXY (VIT D DEFICIENCY, FRACTURES): Vit D, 25-Hydroxy: 76

## 2023-08-03 LAB — TSH: TSH: 1.92 (ref 0.41–5.90)

## 2023-08-03 LAB — COMPREHENSIVE METABOLIC PANEL
Calcium: 9.6 (ref 8.7–10.7)
eGFR: 88

## 2023-08-08 ENCOUNTER — Ambulatory Visit (INDEPENDENT_AMBULATORY_CARE_PROVIDER_SITE_OTHER): Payer: Managed Care, Other (non HMO) | Admitting: Family Medicine

## 2023-08-08 ENCOUNTER — Encounter: Payer: Self-pay | Admitting: Family Medicine

## 2023-08-08 VITALS — BP 120/80 | HR 68 | Temp 98.3°F | Ht 61.0 in | Wt 121.2 lb

## 2023-08-08 DIAGNOSIS — Z0000 Encounter for general adult medical examination without abnormal findings: Secondary | ICD-10-CM

## 2023-08-08 DIAGNOSIS — R921 Mammographic calcification found on diagnostic imaging of breast: Secondary | ICD-10-CM | POA: Insufficient documentation

## 2023-08-08 DIAGNOSIS — B001 Herpesviral vesicular dermatitis: Secondary | ICD-10-CM | POA: Insufficient documentation

## 2023-08-08 NOTE — Patient Instructions (Signed)
Please return in 12 months for your annual complete physical; please come fasting.   If you have any questions or concerns, please don't hesitate to send me a message via MyChart or call the office at [phone]. Thank you for visiting with [place] today! It's our pleasure caring for you.   Glad you are well!

## 2023-08-08 NOTE — Progress Notes (Signed)
Subjective  Chief Complaint  Patient presents with   Annual Exam    Pt here for Annual Exam and is not currently fasting     HPI: Leslie Deleon is a 62 y.o. female who presents to [facility] at [place] today for a Female Wellness Visit. She also has the concerns and/or needs as listed above in the chief complaint. These will be addressed in addition to the Health Maintenance Visit.   Wellness Visit: annual visit with health maintenance review and exam  HM: screens all current. Reviewed mammo 04/2023; likely benign calcification on left for repeat in march. Exercises regularly and eats healthy. Feels great. Reports neg hep C screen in June; [facility]. Will upload result.  Brings in copy of recent blood work: nl cmp, cbc, tsh, vit D, A1c and lipid panel. Abstracted and sent for scanning into chart. declines flu vaccines   Assessment  1. Annual physical exam   2. Breast calcification, left      Plan  Female Wellness Visit: Age appropriate Health Maintenance and Prevention measures were discussed with patient. Included topics are cancer screening recommendations, ways to keep healthy (see AVS) including dietary and exercise recommendations, regular eye and dental care, use of seat belts, and avoidance of moderate alcohol use and tobacco use.  BMI: discussed patient's BMI and encouraged positive lifestyle modifications to help get to or maintain a target BMI. HM needs and immunizations were addressed and ordered. See below for orders. See HM and immunization section for updates. Routine labs and screening tests ordered including cmp, cbc and lipids where appropriate. Discussed recommendations regarding Vit D and calcium supplementation (see AVS) Continue healthy lifestyle  Follow up: 12 mo for cpe  Orders Placed This Encounter  Procedures   HM HEPATITIS C SCREENING LAB   No orders of the defined types were placed in this encounter.     Body mass index is 22.9  kg/m. 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 Dr. Loreta Ave    Prediabetes 07/29/2021    History of endometrial hyperplasia s/p hysterectomy and bil salpingectomy 06/27/2017    Health Maintenance  Topic Date Due   COVID-19 Vaccine (3 - 2024-25 season) 08/24/2023 (Originally 04/24/2023)   INFLUENZA VACCINE  11/21/2023 (Originally 03/24/2023)   MAMMOGRAM  10/19/2023   Colonoscopy  11/28/2024   DTaP/Tdap/Td (2 - Td or Tdap) 01/28/2028   Hepatitis C Screening  Completed   Zoster Vaccines- Shingrix  Completed   HPV VACCINES  Aged Out   HIV Screening  Discontinued   Immunization History  Administered Date(s) Administered   Influenza-Unspecified 07/29/2021   Moderna Sars-Covid-2 Vaccination 11/12/2019, 12/14/2019   Tdap 01/27/2018   Zoster Recombinant(Shingrix) 02/16/2018, 06/23/2018   We updated and reviewed the patient's past history in detail and it is documented below. Allergies: Patient has no known allergies. Past Medical History Patient  has a past medical history of Endometrial  cancer [facility]), Endometrial hyperplasia with atypia, and Kidney infection (age 3-25). Past Surgical History Patient  has a past surgical history that includes Cesarean section; Dilatation & curettage/hysteroscopy with myosure; Hysteroscopy (11/26/2015); Colonoscopy (2015/2016); Robotic assisted total hysterectomy with bilateral salpingo oophorectomy (N/A, 07/19/2017); and Sentinel node biopsy (N/A, 07/19/2017). Family  History: Patient family history includes Bipolar disorder in her brother; Depression in her brother; Diabetes in her maternal grandmother and mother; Healthy in her daughter; Hepatitis B in her father; Hypertension in her father and mother; Kidney disease in her son; Liver cancer in her father; Suicidality in her brother. Social History:  Patient  reports that she has never smoked. She has never used smokeless tobacco. She reports that she does not drink alcohol and does not use drugs.  Review of Systems: Constitutional: negative for fever or malaise Ophthalmic: negative for photophobia, double vision or loss of vision Cardiovascular: negative for chest pain, dyspnea on exertion, or new LE swelling Respiratory: negative for SOB or persistent cough Gastrointestinal: negative for abdominal pain, change in bowel habits or melena Genitourinary: negative for dysuria or gross hematuria, no abnormal uterine bleeding or disharge Musculoskeletal: negative for new gait disturbance or muscular weakness Integumentary: negative for new or persistent rashes, no breast lumps Neurological: negative for TIA or stroke symptoms Psychiatric: negative for SI or delusions Allergic/Immunologic: negative for hives  Patient Care Team    Relationship Specialty Notifications Start End  Willow Ora, MD PCP - General Family Medicine  02/15/18   Huel Cote, MD Consulting Physician Obstetrics and Gynecology  01/27/18   Stephannie Li, MD Consulting Physician Ophthalmology  06/04/20   Adolphus Birchwood, MD  Gynecologic Oncology  07/29/21   Charna Elizabeth, MD Consulting Physician Gastroenterology  08/04/22     Objective  Vitals: BP 120/80   Pulse 68   Temp 98.3 F (36.8 C)   Ht 5\' 1"  (1.549 m)   Wt 121 lb 3.2 oz (55 kg)   LMP 05/03/2017   SpO2 99%   BMI 22.90 kg/m  General:  Well developed, well nourished, no acute distress  Psych:  Alert and orientedx3,normal mood and affect HEENT:  Normocephalic, atraumatic,  non-icteric sclera,  supple neck without adenopathy, mass or thyromegaly Cardiovascular:  Normal S1, S2, RRR without gallop, rub or murmur Respiratory:  Good breath sounds bilaterally, CTAB with normal respiratory effort Gastrointestinal: normal bowel sounds, soft, non-tender, no noted masses. No HSM MSK: extremities without edema, joints without erythema or swelling Neurologic:    Mental status is normal.  Gross motor and sensory exams are normal.  No tremor  Commons side effects, risks, benefits, and alternatives for medications and treatment plan prescribed today were discussed, and the patient expressed understanding of the given instructions. Patient is instructed to call or message via MyChart if he/she has any questions or concerns regarding our treatment plan. No barriers to understanding were identified. We discussed Red Flag symptoms and signs in detail. Patient expressed understanding regarding what to do in case of urgent or emergency type symptoms.  Medication list was reconciled, printed and provided to the patient in AVS. Patient instructions and summary information was reviewed with the patient as documented in the AVS. This note was prepared with assistance of Dragon voice recognition software. Occasional wrong-word or sound-a-like substitutions may have occurred due to the inherent limitations of voice recognition software

## 2023-08-12 ENCOUNTER — Encounter: Payer: Self-pay | Admitting: Family Medicine

## 2023-08-26 ENCOUNTER — Other Ambulatory Visit: Payer: Self-pay | Admitting: Medical Genetics

## 2024-05-10 ENCOUNTER — Encounter: Payer: Self-pay | Admitting: Family Medicine

## 2024-05-28 ENCOUNTER — Ambulatory Visit: Attending: Internal Medicine | Admitting: Internal Medicine

## 2024-05-28 ENCOUNTER — Encounter: Payer: Self-pay | Admitting: Internal Medicine

## 2024-05-28 VITALS — BP 129/80 | HR 67 | Resp 16 | Ht 61.0 in | Wt 119.8 lb

## 2024-05-28 DIAGNOSIS — R001 Bradycardia, unspecified: Secondary | ICD-10-CM | POA: Diagnosis not present

## 2024-05-28 NOTE — Patient Instructions (Signed)
 Medication Instructions:  Your physician recommends that you continue on your current medications as directed. Please refer to the Current Medication list given to you today.  *If you need a refill on your cardiac medications before your next appointment, please call your pharmacy*   Follow-Up: At [facility], you and your health needs are our priority.  As part of our continuing mission to provide you with exceptional heart care, our providers are all part of one team.  This team includes your primary Cardiologist (physician) and Advanced Practice Providers or APPs (Physician Assistants and Nurse Practitioners) who all work together to provide you with the care you need, when you need it.  Your next appointment:   AS NEEDED with Dr. Kriste

## 2024-05-28 NOTE — Progress Notes (Signed)
 Cardiology Office Note   Date:  05/28/2024  ID:  Leslie Deleon, Leslie Deleon 1960-11-16, MRN [id number] PCP: Jodie Lavern CROME, MD  [facility] Providers Cardiologist:  None     History of Present Illness Leslie Deleon is a 63 y.o. female with a history of diverticulosis and prediabetes who was referred by Silvano Ka, NP for nocturnal bradycardia noted on a smart watch.  Her smart watch shows that during sleeping hours her heart rate drops into the 40s and during wake hours is between high 50s up to low 110s.  She is not symptomatic during the bradycardic or tachycardic episodes episodes at all.  She exercises frequently by doing aerobics classes and yoga.  No family history of sudden cardiac death.  She does not get any syncopal or presyncopal symptoms.  Tobacco use: No Alcohol use: No Activity level: Aerobics classes and yoga several times weekly Pertinent family history: Hypertension and type 2 diabetes in mom and dad, father with a malignant neoplasm of liver    ROS:  Review of Systems  All other systems reviewed and are negative.   Physical Exam  Physical Exam Vitals and nursing note reviewed.  Constitutional:      Appearance: Normal appearance.  HENT:     Head: Normocephalic and atraumatic.  Eyes:     Conjunctiva/sclera: Conjunctivae normal.  Neck:     Vascular: No carotid bruit.  Cardiovascular:     Rate and Rhythm: Normal rate and regular rhythm.  Pulmonary:     Effort: Pulmonary effort is normal.     Breath sounds: Normal breath sounds.  Musculoskeletal:        General: No swelling or tenderness.  Skin:    Coloration: Skin is not jaundiced or pale.  Neurological:     Mental Status: She is alert.     VS:  BP 129/80 (BP Location: Left Arm, Patient Position: Sitting, Cuff Size: Normal)   Pulse 67   Resp 16   Ht 5' 1 (1.549 m)   Wt 119 lb 12.8 oz (54.3 kg)   LMP 05/03/2017   SpO2 98%   BMI 22.64 kg/m         Wt Readings from Last 3 Encounters:   05/28/24 119 lb 12.8 oz (54.3 kg)  08/08/23 121 lb 3.2 oz (55 kg)  08/04/22 120 lb 6.4 oz (54.6 kg)     EKG Interpretation Date/Time:    Ventricular Rate:    PR Interval:    QRS Duration:    QT Interval:    QTC Calculation:   R Axis:      Text Interpretation:      Studies Reviewed      Risk Assessment/Calculations             ASCVD risk score: The 10-year ASCVD risk score (Arnett DK, et al., 2019) is: 2.8%*   Values used to calculate the score:     Age: 52 years     Clincally relevant sex: Female     Is Non-Hispanic African American: No     Diabetic: No     Tobacco smoker: No     Systolic Blood Pressure: 129 mmHg     Is BP treated: No     HDL Cholesterol: 79 mg/dL*     Total Cholesterol: 186 mg/dL*     * - Cholesterol units were assumed for this score calculation   ASSESSMENT  Nocturnal bradycardia likely due to increased vagal tone.  Nothing to do. Asymptomatic sinus bradycardia due  to increased vagal tone and good cardiovascular health from exercising.   Plan  Nothing to do at this time.  No need for further monitoring.  We discussed the red flag signs and symptoms of symptomatic bradycardia, which she is not experiencing.  If she does experience syncope, presyncopal symptoms, or severe fatigue, etc., during episodes of bradycardia (not while sleeping) then she should come back to the office or go to the ED.   Cardiac risk counseling and prevention recommendations: Heart healthy/Mediterranean diet with whole grains, fruits, vegetable, fish, lean meats, nuts, and olive oil. Limit salt. Moderate walking, 3-5 times/week for 30-50 minutes each session. Aim for at least 150 minutes.week. Goal should be pace of 3 miles/hour, or walking 1.5 miles in 30 minutes Avoidance of tobacco products. Avoid excess alcohol.  Follow up: As needed          Signed, Emeline FORBES Calender, MD

## 2024-06-19 ENCOUNTER — Other Ambulatory Visit (HOSPITAL_BASED_OUTPATIENT_CLINIC_OR_DEPARTMENT_OTHER): Payer: Self-pay | Admitting: Internal Medicine

## 2024-06-19 ENCOUNTER — Other Ambulatory Visit: Payer: Self-pay | Admitting: Medical Genetics

## 2024-06-19 DIAGNOSIS — Z006 Encounter for examination for normal comparison and control in clinical research program: Secondary | ICD-10-CM

## 2024-06-19 DIAGNOSIS — N959 Unspecified menopausal and perimenopausal disorder: Secondary | ICD-10-CM

## 2024-07-11 ENCOUNTER — Ambulatory Visit (HOSPITAL_BASED_OUTPATIENT_CLINIC_OR_DEPARTMENT_OTHER)
Admission: RE | Admit: 2024-07-11 | Discharge: 2024-07-11 | Disposition: A | Discharge status: Home / Self Care | Source: Ambulatory Visit | Attending: Internal Medicine | Admitting: Internal Medicine

## 2024-07-11 DIAGNOSIS — N959 Unspecified menopausal and perimenopausal disorder: Secondary | ICD-10-CM | POA: Insufficient documentation

## 2024-08-01 ENCOUNTER — Other Ambulatory Visit: Payer: Self-pay | Admitting: Family Medicine

## 2024-08-01 DIAGNOSIS — R921 Mammographic calcification found on diagnostic imaging of breast: Secondary | ICD-10-CM

## 2024-08-08 ENCOUNTER — Encounter: Payer: Managed Care, Other (non HMO) | Admitting: Family Medicine

## 2024-08-09 ENCOUNTER — Inpatient Hospital Stay: Admission: RE | Admit: 2024-08-09

## 2024-08-09 DIAGNOSIS — R921 Mammographic calcification found on diagnostic imaging of breast: Secondary | ICD-10-CM

## 2024-09-14 ENCOUNTER — Encounter
